# Patient Record
Sex: Male | Born: 1965 | Hispanic: No | Marital: Married | Smoking: Never smoker
Health system: Southern US, Community
[De-identification: ages and names within clinical notes are randomized; demographics above are authoritative.]

## PROBLEM LIST (undated history)

## (undated) DIAGNOSIS — I10 Essential (primary) hypertension: Secondary | ICD-10-CM

## (undated) DIAGNOSIS — E785 Hyperlipidemia, unspecified: Secondary | ICD-10-CM

## (undated) DIAGNOSIS — E119 Type 2 diabetes mellitus without complications: Secondary | ICD-10-CM

## (undated) HISTORY — DX: Essential (primary) hypertension: I10

## (undated) HISTORY — DX: Type 2 diabetes mellitus without complications: E11.9

## (undated) HISTORY — DX: Hyperlipidemia, unspecified: E78.5

---

## 2009-10-16 ENCOUNTER — Ambulatory Visit: Payer: Self-pay | Admitting: Family Medicine

## 2010-04-09 ENCOUNTER — Ambulatory Visit
Admission: RE | Admit: 2010-04-09 | Discharge: 2010-04-09 | Payer: Self-pay | Source: Home / Self Care | Attending: Family Medicine | Admitting: Family Medicine

## 2010-09-19 ENCOUNTER — Other Ambulatory Visit: Payer: Self-pay | Admitting: Family Medicine

## 2010-11-18 ENCOUNTER — Other Ambulatory Visit: Payer: Self-pay | Admitting: Family Medicine

## 2011-01-14 ENCOUNTER — Other Ambulatory Visit: Payer: Self-pay | Admitting: Family Medicine

## 2011-01-17 ENCOUNTER — Telehealth: Payer: Self-pay | Admitting: Family Medicine

## 2011-01-17 MED ORDER — METFORMIN HCL 1000 MG PO TABS: 1000.0000 mg | ORAL_TABLET | Freq: Two times a day (BID) | ORAL | Status: DC

## 2011-01-17 NOTE — Telephone Encounter (Signed)
SENTG MED IN FOR THE 3RD TIME

## 2011-05-10 ENCOUNTER — Other Ambulatory Visit: Payer: Self-pay | Admitting: Family Medicine

## 2011-05-23 ENCOUNTER — Other Ambulatory Visit: Payer: Self-pay | Admitting: Family Medicine

## 2011-06-22 ENCOUNTER — Telehealth: Payer: Self-pay | Admitting: Internal Medicine

## 2011-06-22 MED ORDER — METFORMIN HCL 1000 MG PO TABS: ORAL_TABLET | ORAL | Status: DC

## 2011-06-22 NOTE — Telephone Encounter (Signed)
Called pt to let him know that he needed to come in for a med check before getting in more refills. However pt stated he was on the road and wouldn't be back in til around the 29th, and needed an appt on a Friday. Got him scheduled for April 12th for a med check and gave him a 30day supply so he wouldn't run out.

## 2011-07-15 ENCOUNTER — Other Ambulatory Visit: Payer: Self-pay | Admitting: *Deleted

## 2011-07-15 ENCOUNTER — Ambulatory Visit (INDEPENDENT_AMBULATORY_CARE_PROVIDER_SITE_OTHER): Payer: No Typology Code available for payment source | Admitting: Family Medicine

## 2011-07-15 ENCOUNTER — Encounter: Payer: Self-pay | Admitting: Family Medicine

## 2011-07-15 VITALS — BP 132/94 | HR 72 | Ht 75.0 in | Wt 280.0 lb

## 2011-07-15 DIAGNOSIS — E785 Hyperlipidemia, unspecified: Secondary | ICD-10-CM | POA: Insufficient documentation

## 2011-07-15 DIAGNOSIS — I10 Essential (primary) hypertension: Secondary | ICD-10-CM

## 2011-07-15 DIAGNOSIS — E1169 Type 2 diabetes mellitus with other specified complication: Secondary | ICD-10-CM

## 2011-07-15 DIAGNOSIS — E669 Obesity, unspecified: Secondary | ICD-10-CM

## 2011-07-15 DIAGNOSIS — E1159 Type 2 diabetes mellitus with other circulatory complications: Secondary | ICD-10-CM

## 2011-07-15 DIAGNOSIS — Z79899 Other long term (current) drug therapy: Secondary | ICD-10-CM

## 2011-07-15 DIAGNOSIS — Z9119 Patient's noncompliance with other medical treatment and regimen: Secondary | ICD-10-CM

## 2011-07-15 DIAGNOSIS — Z23 Encounter for immunization: Secondary | ICD-10-CM

## 2011-07-15 DIAGNOSIS — I152 Hypertension secondary to endocrine disorders: Secondary | ICD-10-CM | POA: Insufficient documentation

## 2011-07-15 DIAGNOSIS — E119 Type 2 diabetes mellitus without complications: Secondary | ICD-10-CM

## 2011-07-15 LAB — POCT GLYCOSYLATED HEMOGLOBIN (HGB A1C): Hemoglobin A1C: 10

## 2011-07-15 MED ORDER — ATORVASTATIN CALCIUM 20 MG PO TABS: 20.0000 mg | ORAL_TABLET | Freq: Every day | ORAL | Status: DC

## 2011-07-15 MED ORDER — LISINOPRIL-HYDROCHLOROTHIAZIDE 10-12.5 MG PO TABS: 1.0000 | ORAL_TABLET | Freq: Every day | ORAL | Status: DC

## 2011-07-15 MED ORDER — SAXAGLIPTIN-METFORMIN ER 2.5-1000 MG PO TB24: 2.0000 | ORAL_TABLET | ORAL | Status: DC

## 2011-07-15 NOTE — Progress Notes (Signed)
  Subjective:    Patient ID: Justin Jackson, male    DOB: May 31, 1965, 46 y.o.   MRN: [id number]  HPI He is here for recheck. Review of the record indicates he has not been here in approximately one year. He works as a Medical laboratory scientific officer and has used this as a reason to not followup appropriately or get diabetes education. He does take the metformin twice per day and has had difficulty with diarrhea. He states his fasting blood sugars run between 150 and 200. His exercise pattern is erratic due to his working. He does say however that he has lost 9 pounds since last year. He has not checked them after meals. He did have an eye exam approximately 1 year ago. Social history was reviewed.   Review of Systems     Objective:   Physical Exam alert and in no distress. Tympanic membranes and canals are normal. Throat is clear. Tonsils are normal. Neck is supple without adenopathy or thyromegaly. Cardiac exam shows a regular sinus rhythm without murmurs or gallops. Lungs are clear to auscultation. Foot exam recorded       Assessment & Plan:   1. Type II or unspecified type diabetes mellitus without mention of complication, not stated as uncontrolled  POCT HgB A1C, Saxagliptin-Metformin 2.08-998 MG TB24, Amb ref to Medical Nutrition Therapy-MNT, CBC with Differential, Comprehensive metabolic panel, Lipid panel, POCT UA - Microalbumin, Ambulatory referral to Ophthalmology, Hepatitis B vaccine adult IM  2. Hypertension associated with diabetes  lisinopril-hydrochlorothiazide (PRINZIDE,ZESTORETIC) 10-12.5 MG per tablet  3. Hyperlipidemia LDL goal <70  atorvastatin (LIPITOR) 20 MG tablet  4. Obesity (BMI 30-39.9)  Amb ref to Medical Nutrition Therapy-MNT  5. Encounter for long-term (current) use of other medications    6. Personal history of noncompliance with medical treatment, presenting hazards to health     discussed in detail diabetes and its risks to him in regard to blindness, CVA, heart failure, kidney  failure etc. Strongly encouraged him to go to nutrition classes. I will also place him on the above mentioned drug regimen. He is to call me if he has any difficulties with that.

## 2011-07-16 LAB — COMPREHENSIVE METABOLIC PANEL
ALT: 67 U/L — ABNORMAL HIGH (ref 0–53)
AST: 55 U/L — ABNORMAL HIGH (ref 0–37)
Calcium: 9.5 mg/dL (ref 8.4–10.5)
Chloride: 102 mEq/L (ref 96–112)
Creat: 0.96 mg/dL (ref 0.50–1.35)
Total Bilirubin: 1 mg/dL (ref 0.3–1.2)

## 2011-07-16 LAB — CBC WITH DIFFERENTIAL/PLATELET
Basophils Absolute: 0.1 10*3/uL (ref 0.0–0.1)
Eosinophils Relative: 3 % (ref 0–5)
Lymphocytes Relative: 37 % (ref 12–46)
MCV: 87.3 fL (ref 78.0–100.0)
Neutro Abs: 6.7 10*3/uL (ref 1.7–7.7)
Neutrophils Relative %: 55 % (ref 43–77)
Platelets: 208 10*3/uL (ref 150–400)
RDW: 13.8 % (ref 11.5–15.5)
WBC: 12.2 10*3/uL — ABNORMAL HIGH (ref 4.0–10.5)

## 2011-07-16 LAB — LIPID PANEL
Total CHOL/HDL Ratio: 3.4 Ratio
VLDL: 25 mg/dL (ref 0–40)

## 2011-07-17 NOTE — Progress Notes (Signed)
Quick Note:  The blood work is normal ______ 

## 2011-08-04 ENCOUNTER — Encounter: Payer: Self-pay | Admitting: Internal Medicine

## 2011-08-12 ENCOUNTER — Other Ambulatory Visit: Payer: No Typology Code available for payment source

## 2011-08-19 ENCOUNTER — Ambulatory Visit: Payer: No Typology Code available for payment source | Admitting: *Deleted

## 2011-12-02 ENCOUNTER — Ambulatory Visit: Payer: No Typology Code available for payment source | Admitting: Family Medicine

## 2011-12-16 ENCOUNTER — Ambulatory Visit: Payer: No Typology Code available for payment source | Admitting: Family Medicine

## 2012-01-16 ENCOUNTER — Ambulatory Visit: Payer: No Typology Code available for payment source | Admitting: Family Medicine

## 2012-01-31 ENCOUNTER — Other Ambulatory Visit: Payer: Self-pay | Admitting: Family Medicine

## 2012-03-05 ENCOUNTER — Other Ambulatory Visit: Payer: Self-pay | Admitting: Family Medicine

## 2012-04-03 ENCOUNTER — Other Ambulatory Visit: Payer: Self-pay | Admitting: Family Medicine

## 2012-04-24 ENCOUNTER — Other Ambulatory Visit: Payer: Self-pay

## 2012-04-24 MED ORDER — SAXAGLIPTIN-METFORMIN ER 2.5-1000 MG PO TB24: 2.5000 mg | ORAL_TABLET | Freq: Two times a day (BID) | ORAL | Status: DC

## 2012-04-24 NOTE — Telephone Encounter (Signed)
Refilled diabetes med but he must have diabetes check before anymore refills

## 2012-06-18 ENCOUNTER — Other Ambulatory Visit: Payer: Self-pay

## 2012-06-18 ENCOUNTER — Telehealth: Payer: Self-pay | Admitting: Family Medicine

## 2012-06-18 MED ORDER — SAXAGLIPTIN-METFORMIN ER 2.5-1000 MG PO TB24: 2.5000 mg | ORAL_TABLET | Freq: Two times a day (BID) | ORAL | Status: DC

## 2012-06-18 NOTE — Telephone Encounter (Signed)
Pt called for refill for Kombiglyze, he has diabetic follow up with [place] on 06/29/12.  Wants refill to [facility]

## 2012-06-18 NOTE — Telephone Encounter (Signed)
SENT IN DIABETES MED PT MUST HAVE DIABETES CHECK

## 2012-06-18 NOTE — Telephone Encounter (Signed)
THIS WAS ALREADY DONE THIS MORNING

## 2012-06-29 ENCOUNTER — Ambulatory Visit (INDEPENDENT_AMBULATORY_CARE_PROVIDER_SITE_OTHER): Payer: No Typology Code available for payment source | Admitting: Family Medicine

## 2012-06-29 ENCOUNTER — Encounter: Payer: Self-pay | Admitting: Family Medicine

## 2012-06-29 VITALS — BP 132/92 | HR 76 | Ht 74.0 in | Wt 278.0 lb

## 2012-06-29 DIAGNOSIS — E669 Obesity, unspecified: Secondary | ICD-10-CM

## 2012-06-29 DIAGNOSIS — Z9119 Patient's noncompliance with other medical treatment and regimen: Secondary | ICD-10-CM

## 2012-06-29 DIAGNOSIS — I10 Essential (primary) hypertension: Secondary | ICD-10-CM

## 2012-06-29 DIAGNOSIS — Z91199 Patient's noncompliance with other medical treatment and regimen due to unspecified reason: Secondary | ICD-10-CM

## 2012-06-29 DIAGNOSIS — Z79899 Other long term (current) drug therapy: Secondary | ICD-10-CM

## 2012-06-29 DIAGNOSIS — E119 Type 2 diabetes mellitus without complications: Secondary | ICD-10-CM

## 2012-06-29 DIAGNOSIS — E1159 Type 2 diabetes mellitus with other circulatory complications: Secondary | ICD-10-CM

## 2012-06-29 DIAGNOSIS — E1169 Type 2 diabetes mellitus with other specified complication: Secondary | ICD-10-CM

## 2012-06-29 DIAGNOSIS — E785 Hyperlipidemia, unspecified: Secondary | ICD-10-CM

## 2012-06-29 DIAGNOSIS — I152 Hypertension secondary to endocrine disorders: Secondary | ICD-10-CM

## 2012-06-29 LAB — CBC WITH DIFFERENTIAL/PLATELET
Basophils Absolute: 0.1 10*3/uL (ref 0.0–0.1)
Eosinophils Relative: 3 % (ref 0–5)
Lymphocytes Relative: 38 % (ref 12–46)
Neutro Abs: 6.5 10*3/uL (ref 1.7–7.7)
Neutrophils Relative %: 54 % (ref 43–77)
Platelets: 175 10*3/uL (ref 150–400)
RDW: 14.2 % (ref 11.5–15.5)
WBC: 11.7 10*3/uL — ABNORMAL HIGH (ref 4.0–10.5)

## 2012-06-29 LAB — POCT GLYCOSYLATED HEMOGLOBIN (HGB A1C): Hemoglobin A1C: 10.6

## 2012-06-29 MED ORDER — PIOGLITAZONE HCL 30 MG PO TABS: 30.0000 mg | ORAL_TABLET | Freq: Every day | ORAL | Status: DC

## 2012-06-29 MED ORDER — SAXAGLIPTIN-METFORMIN ER 5-1000 MG PO TB24: 1.0000 | ORAL_TABLET | ORAL | Status: DC

## 2012-06-29 NOTE — Progress Notes (Signed)
  Subjective:    Patient ID: Justin Jackson, male    DOB: 1965-06-19, 46 y.o.   MRN: [id number]  HPI He is here for a diabetes recheck. He has not been seen in quite some time. 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 He has been spending over $300 for the Kombiglyze per month. He states his blood sugars run in the 200 range. His exercise is minimal and he blames this on his work schedule. He was given a referral to the nutritionist but could not get it scheduled. His medications were reviewed. Social history was reviewed. He has not seen an eye doctor. He does not check his feet.   Review of Systems     Objective:   Physical Exam Alert and in no distress. Hemoglobin A1c is 10.6        Assessment & Plan:  Type II or unspecified type diabetes mellitus without mention of complication, not stated as uncontrolled - Plan: HgB A1c, POCT UA - Microalbumin, Ambulatory referral to Ophthalmology, Saxagliptin-Metformin 08-998 MG TB24, pioglitazone (ACTOS) 30 MG tablet  Hypertension associated with diabetes - Plan: CBC with Differential, Comprehensive metabolic panel  Hyperlipidemia LDL goal <70 - Plan: Lipid panel  Obesity (BMI 30-39.9)  Personal history of noncompliance with medical treatment, presenting hazards to health  Encounter for long-term (current) use of other medications - Plan: Lipid panel, CBC with Differential, Comprehensive metabolic panel I discussed exercise with him in regard to his work schedule. Recommend that he use a half hour break that he is required to take to exercise. He is also to call the nutritionist to discuss dietary modification. He was on the wrong dosing of the Kombiglyze. I will switch his medication and add Actos. Discussed the risk of Actos specifically in regards to bladder tumors and explained that it is a very rare occurrence. Discussed the fact that and proper care of his diabetes could eventually end up interfering with his  ability to work as a Naval architect in regard to visual changes, blood pressure and potential need for insulin. Recheck here 3 months.

## 2012-06-29 NOTE — Patient Instructions (Signed)
Call [phone] to set up an appointment with the nutritionist. Do not feel your medication if it is going to cost to $300. Call me instead. Keep in mind what I said about exercising while on the road.

## 2012-06-30 LAB — LIPID PANEL
HDL: 45 mg/dL (ref >39)
LDL Cholesterol: 92 mg/dL (ref 0–99)
Total CHOL/HDL Ratio: 3.6 Ratio
VLDL: 25 mg/dL (ref 0–40)

## 2012-06-30 LAB — COMPREHENSIVE METABOLIC PANEL
ALT: 66 U/L — ABNORMAL HIGH (ref 0–53)
AST: 45 U/L — ABNORMAL HIGH (ref 0–37)
Calcium: 10.1 mg/dL (ref 8.4–10.5)
Chloride: 100 mEq/L (ref 96–112)
Creat: 0.98 mg/dL (ref 0.50–1.35)
Sodium: 137 mEq/L (ref 135–145)

## 2012-07-02 ENCOUNTER — Other Ambulatory Visit: Payer: Self-pay

## 2012-07-02 DIAGNOSIS — I152 Hypertension secondary to endocrine disorders: Secondary | ICD-10-CM

## 2012-07-02 DIAGNOSIS — I10 Essential (primary) hypertension: Secondary | ICD-10-CM

## 2012-07-02 DIAGNOSIS — E785 Hyperlipidemia, unspecified: Secondary | ICD-10-CM

## 2012-07-02 MED ORDER — LISINOPRIL-HYDROCHLOROTHIAZIDE 10-12.5 MG PO TABS: 1.0000 | ORAL_TABLET | Freq: Every day | ORAL | Status: DC

## 2012-07-02 MED ORDER — ATORVASTATIN CALCIUM 20 MG PO TABS: 20.0000 mg | ORAL_TABLET | Freq: Every day | ORAL | Status: DC

## 2012-07-02 NOTE — Progress Notes (Signed)
Quick Note:  CALLED PT # [T NOTIFIED OF LABS NORMAL PT STATES SAXAGLIPTIN METFORMIN 08-998 IS 136.00 A MONTH AND ACTOS IS 15.00 A MONTH ______

## 2012-10-10 ENCOUNTER — Telehealth: Payer: Self-pay | Admitting: Family Medicine

## 2012-10-10 DIAGNOSIS — E119 Type 2 diabetes mellitus without complications: Secondary | ICD-10-CM

## 2012-10-11 ENCOUNTER — Other Ambulatory Visit: Payer: Self-pay

## 2012-10-11 ENCOUNTER — Telehealth: Payer: Self-pay

## 2012-10-11 MED ORDER — GLYBURIDE 5 MG PO TABS: 5.0000 mg | ORAL_TABLET | Freq: Every day | ORAL | Status: DC

## 2012-10-11 MED ORDER — SAXAGLIPTIN-METFORMIN ER 2.5-1000 MG PO TB24: 2.0000 | ORAL_TABLET | ORAL | Status: DC

## 2012-10-11 NOTE — Telephone Encounter (Signed)
LEFT WORD FOR WORD MESSAGE Let him know he's not on maximum dosing and I called it in; and have him reschedule in 3 months

## 2012-10-11 NOTE — Telephone Encounter (Signed)
Let him know that he's not on maximum dosing I would like to get him on maximum dosing before switching have him reschedule in 3 months

## 2012-10-11 NOTE — Telephone Encounter (Signed)
Sent med in per jcl 

## 2012-10-11 NOTE — Telephone Encounter (Signed)
I EXPLAINED TO THE PT THAT ACTUALLY HE WAS GETTING DOUBLE THE METFORMIN SINCE HE WAS ON 08/998 ONCE A DAY AND MED WAS CHANGED TO 2.08/998 MG 2 TIMES A DAY PATIENT WAS INSISTENT ON GLIMEPIRIDE I TOLD HIM OK IT WOULD BE SENT IN

## 2012-10-11 NOTE — Telephone Encounter (Signed)
Let him know he's not on maximum dosing and I called it in; and have him reschedule in 3 months

## 2012-10-11 NOTE — Telephone Encounter (Signed)
Go ahead and call in the glyburide which I think is 5 mg a day

## 2012-10-11 NOTE — Telephone Encounter (Signed)
PATIENT DOESN'T WANT THIS MED HE SAID IT IS ACTUALLY LOWER THAN THE ONE HE IS ALREADY ON HE WANTS ONE THAT WILL LOWER HIS SUGARS SAID HE DISCUSSED THIS WITH YOU AT HIS LAST APPOINTMENT HE WANTS GLIMEPIRIDE SAID HE HAS FAMILY MEMBERS ON IT AND IT WORKS GOOD HE WAS VERY DEMANDING AND UPSET SO PLEASE ADVISE

## 2012-10-12 ENCOUNTER — Ambulatory Visit: Payer: No Typology Code available for payment source | Admitting: Family Medicine

## 2012-12-01 ENCOUNTER — Other Ambulatory Visit: Payer: Self-pay | Admitting: Family Medicine

## 2013-02-16 ENCOUNTER — Other Ambulatory Visit: Payer: Self-pay | Admitting: Family Medicine

## 2013-05-12 ENCOUNTER — Other Ambulatory Visit: Payer: Self-pay | Admitting: Family Medicine

## 2014-07-13 ENCOUNTER — Other Ambulatory Visit: Payer: Self-pay | Admitting: Family Medicine

## 2015-08-31 ENCOUNTER — Emergency Department (HOSPITAL_COMMUNITY)
Admission: EM | Admit: 2015-08-31 | Discharge: 2015-08-31 | Disposition: A | Discharge status: Home / Self Care | Payer: 59 | Attending: Emergency Medicine | Admitting: Emergency Medicine

## 2015-08-31 ENCOUNTER — Encounter (HOSPITAL_COMMUNITY): Payer: Self-pay | Admitting: Emergency Medicine

## 2015-08-31 ENCOUNTER — Emergency Department (HOSPITAL_COMMUNITY): Payer: 59

## 2015-08-31 DIAGNOSIS — Z7984 Long term (current) use of oral hypoglycemic drugs: Secondary | ICD-10-CM | POA: Diagnosis not present

## 2015-08-31 DIAGNOSIS — Z79891 Long term (current) use of opiate analgesic: Secondary | ICD-10-CM | POA: Diagnosis not present

## 2015-08-31 DIAGNOSIS — X509XXA Other and unspecified overexertion or strenuous movements or postures, initial encounter: Secondary | ICD-10-CM | POA: Insufficient documentation

## 2015-08-31 DIAGNOSIS — M25551 Pain in right hip: Secondary | ICD-10-CM | POA: Diagnosis present

## 2015-08-31 DIAGNOSIS — Y939 Activity, unspecified: Secondary | ICD-10-CM | POA: Insufficient documentation

## 2015-08-31 DIAGNOSIS — Y92812 Truck as the place of occurrence of the external cause: Secondary | ICD-10-CM | POA: Insufficient documentation

## 2015-08-31 DIAGNOSIS — I10 Essential (primary) hypertension: Secondary | ICD-10-CM | POA: Insufficient documentation

## 2015-08-31 DIAGNOSIS — E119 Type 2 diabetes mellitus without complications: Secondary | ICD-10-CM | POA: Insufficient documentation

## 2015-08-31 DIAGNOSIS — Z79899 Other long term (current) drug therapy: Secondary | ICD-10-CM | POA: Insufficient documentation

## 2015-08-31 DIAGNOSIS — R52 Pain, unspecified: Secondary | ICD-10-CM

## 2015-08-31 DIAGNOSIS — Y999 Unspecified external cause status: Secondary | ICD-10-CM | POA: Insufficient documentation

## 2015-08-31 LAB — URINALYSIS, ROUTINE W REFLEX MICROSCOPIC
BILIRUBIN URINE: NEGATIVE
Glucose, UA: >1000 mg/dL — AB
HGB URINE DIPSTICK: NEGATIVE
Ketones, ur: NEGATIVE mg/dL
Leukocytes, UA: NEGATIVE
NITRITE: NEGATIVE
PROTEIN: NEGATIVE mg/dL
SPECIFIC GRAVITY, URINE: 1.04 — AB (ref 1.005–1.030)
pH: 5.5 (ref 5.0–8.0)

## 2015-08-31 LAB — I-STAT CHEM 8, ED
BUN: 11 mg/dL (ref 6–20)
CALCIUM ION: 1.14 mmol/L (ref 1.12–1.23)
Chloride: 99 mmol/L — ABNORMAL LOW (ref 101–111)
Creatinine, Ser: 0.8 mg/dL (ref 0.61–1.24)
Glucose, Bld: 307 mg/dL — ABNORMAL HIGH (ref 65–99)
HCT: 49 % (ref 39.0–52.0)
Hemoglobin: 16.7 g/dL (ref 13.0–17.0)
Potassium: 3.9 mmol/L (ref 3.5–5.1)
SODIUM: 138 mmol/L (ref 135–145)
TCO2: 24 mmol/L (ref 0–100)

## 2015-08-31 LAB — URINE MICROSCOPIC-ADD ON
Bacteria, UA: NONE SEEN
RBC / HPF: NONE SEEN RBC/hpf (ref 0–5)
SQUAMOUS EPITHELIAL / LPF: NONE SEEN

## 2015-08-31 LAB — CBG MONITORING, ED: GLUCOSE-CAPILLARY: 235 mg/dL — AB (ref 65–99)

## 2015-08-31 MED ORDER — MELOXICAM 15 MG PO TABS: 15.0000 mg | ORAL_TABLET | Freq: Every day | ORAL | Status: DC

## 2015-08-31 MED ORDER — KETOROLAC TROMETHAMINE 60 MG/2ML IM SOLN
60.0000 mg | Freq: Once | INTRAMUSCULAR | Status: AC
  Administered 2015-08-31: 60 mg via INTRAMUSCULAR
  Filled 2015-08-31: qty 2

## 2015-08-31 MED ORDER — OXYCODONE-ACETAMINOPHEN 5-325 MG PO TABS
2.0000 | ORAL_TABLET | Freq: Once | ORAL | Status: AC
  Administered 2015-08-31: 2 via ORAL
  Filled 2015-08-31: qty 2

## 2015-08-31 MED ORDER — HYDROCODONE-ACETAMINOPHEN 5-325 MG PO TABS: 2.0000 | ORAL_TABLET | ORAL | Status: DC | PRN

## 2015-08-31 NOTE — Discharge Instructions (Signed)
Follow-up with your primary care provider in 2 days to have your hip reevaluated. Contact Dr. Luiz BlareGraves office, orthopedist, to be seen regarding your hip pain and possible trochanteric bursitis as early as tomorrow.  Take the Mobic as prescribed and use Tylenol in between for breakthrough pain. Do not take Tylenol with the Norco. Take Norco at night as needed for pain.  Return to the emergency department if you experience worsening back pain and worsening hip pain, numbness/tingling or weakness, nausea, vomiting.  Hip Pain Your hip is the joint between your upper legs and your lower pelvis. The bones, cartilage, tendons, and muscles of your hip joint perform a lot of work each day supporting your body weight and allowing you to move around. Hip pain can range from a minor ache to severe pain in one or both of your hips. Pain may be felt on the inside of the hip joint near the groin, or the outside near the buttocks and upper thigh. You may have swelling or stiffness as well.  HOME CARE INSTRUCTIONS   Take medicines only as directed by your health care provider.  Apply ice to the injured area:  Put ice in a plastic bag.  Place a towel between your skin and the bag.  Leave the ice on for 15-20 minutes at a time, 3-4 times a day.  Keep your leg raised (elevated) when possible to lessen swelling.  Avoid activities that cause pain.  Follow specific exercises as directed by your health care provider.  Sleep with a pillow between your legs on your most comfortable side.  Record how often you have hip pain, the location of the pain, and what it feels like. SEEK MEDICAL CARE IF:   You are unable to put weight on your leg.  Your hip is red or swollen or very tender to touch.  Your pain or swelling continues or worsens after 1 week.  You have increasing difficulty walking.  You have a fever. SEEK IMMEDIATE MEDICAL CARE IF:   You have fallen.  You have a sudden increase in pain and  swelling in your hip. MAKE SURE YOU:   Understand these instructions.  Will watch your condition.  Will get help right away if you are not doing well or get worse.   This information is not intended to replace advice given to you by your health care provider. Make sure you discuss any questions you have with your health care provider.   Document Released: 09/08/2009 Document Revised: 04/11/2014 Document Reviewed: 11/15/2012 Elsevier Interactive Patient Education [employer]2016 [employer]vier Inc.

## 2015-08-31 NOTE — ED Provider Notes (Signed)
CSN: [id number]     Arrival date & time 08/31/15  4098 History   First MD Initiated Contact with Patient 08/31/15 0745     Chief Complaint  Patient presents with  . Hip Pain     (Consider location/radiation/quality/duration/timing/severity/associated sxs/prior Treatment) HPI   Patient is a 50 year old male with history of diabetes, HTN, hyperlipidemia presents the ED with worsening right hip pain for 4 days. He states he may have stepped out of his truck wrong 4 days ago because the pain began later that day and has progressively gotten worse. Patient states this pain is similar to pain he's had in the past that occurs roughly once a month but this pain is more intense. It is sharp, constant, 10/10 in his right lower back, anterior and lateral hip and superior anterior thigh. Patient states lying flat and walking make it worse and nothing makes it better. He has taken Excedrin for the pain which has not helped. Patient denies dysuria, hematuria, numbness/tingling, weakness, nausea, vomiting, change in bowel habits, chest pain, shortness of breath.  Past Medical History  Diagnosis Date  . DM (diabetes mellitus), type 2 (HCC)   . Hyperlipidemia   . Hypertension    History reviewed. No pertinent past surgical history. History reviewed. No pertinent family history. Social History  Substance Use Topics  . Smoking status: Never Smoker   . Smokeless tobacco: None  . Alcohol Use: No    Review of Systems  Constitutional: Negative for fever and chills.  Respiratory: Negative for chest tightness and shortness of breath.   Cardiovascular: Negative for chest pain and leg swelling.  Gastrointestinal: Negative for nausea, vomiting and diarrhea.  Genitourinary: Negative for dysuria, hematuria, penile swelling, scrotal swelling and testicular pain.  Musculoskeletal: Positive for myalgias, back pain and arthralgias. Negative for joint swelling and neck pain.  Skin: Negative for rash.   Neurological: Negative for dizziness, syncope, weakness, numbness and headaches.      Allergies  Review of patient's allergies indicates no known allergies.  Home Medications   Prior to Admission medications   Medication Sig Start Date End Date Taking? Authorizing Provider  atorvastatin (LIPITOR) 20 MG tablet Take 20 mg by mouth daily.   Yes Historical Provider, MD  glyBURIDE (DIABETA) 5 MG tablet TAKE 1 TABLET BY MOUTH DAILY WITH BREAKFAST 12/01/12  Yes Ronnald Nian, MD  lisinopril-hydrochlorothiazide (PRINZIDE,ZESTORETIC) 10-12.5 MG tablet Take 1 tablet by mouth daily.   Yes Historical Provider, MD  metFORMIN (GLUCOPHAGE) 1000 MG tablet Take 1,000 mg by mouth 2 (two) times daily with a meal.   Yes Historical Provider, MD  atorvastatin (LIPITOR) 20 MG tablet Take 1 tablet (20 mg total) by mouth daily. 07/02/12 07/02/13  Ronnald Nian, MD  HYDROcodone-acetaminophen (NORCO/VICODIN) 5-325 MG tablet Take 2 tablets by mouth every 4 (four) hours as needed. 08/31/15   Jerre Simon, PA  lisinopril-hydrochlorothiazide (PRINZIDE,ZESTORETIC) 10-12.5 MG per tablet Take 1 tablet by mouth daily. 07/02/12 07/02/13  Ronnald Nian, MD  meloxicam (MOBIC) 15 MG tablet Take 1 tablet (15 mg total) by mouth daily. 08/31/15   Jerre Simon, PA  pioglitazone (ACTOS) 30 MG tablet Take 1 tablet (30 mg total) by mouth daily. (PATIENT IS DUE FOR DUE FOR DIABETES CHECK [phone]) Patient not taking: Reported on 08/31/2015 02/16/13   Ronnald Nian, MD  Saxagliptin-Metformin 2.08-998 MG TB24 Take 2 capsules by mouth 1 day or 1 dose. Patient not taking: Reported on 08/31/2015 10/11/12   Ronnald Nian, MD  BP 160/113 mmHg  Pulse 108  Temp(Src) 97.9 F (36.6 C) (Oral)  Resp 16  SpO2 98% Physical Exam  Constitutional: He appears well-developed and well-nourished. No distress.  HENT:  Head: Normocephalic and atraumatic.  Eyes: Conjunctivae are normal.  Neck: Normal range of motion.  Cardiovascular: Regular  rhythm and normal heart sounds.  Tachycardia present.  Exam reveals no gallop and no friction rub.   No murmur heard. Pulses:      Posterior tibial pulses are 2+ on the right side, and 2+ on the left side.  Pulmonary/Chest: Effort normal and breath sounds normal. No respiratory distress. He has no wheezes. He has no rales.  Abdominal: Normal appearance and bowel sounds are normal. He exhibits no distension. There is no tenderness. There is no rigidity, no rebound and no guarding.  Musculoskeletal:  Examination of the right hip revealed no ecchymosis, deformity, edema. Full AROM of the right and left hip. Strength 5/5 of bilateral lower extremities including plantar flexion and extension. Patient is neurovascularly intact distally. TTP of the trochanteric bursa.   Neurological: He is alert. He has normal strength. No sensory deficit. Coordination normal.  Skin: Skin is warm and dry. No rash noted. He is not diaphoretic.  Psychiatric: He has a normal mood and affect. His behavior is normal.    ED Course  Procedures (including critical care time)  9:50am: pt states pain is a 7/10 after the Toradol. Will give Percocet to see if his pain improves. Will also check urinalysis to rule out kidney etiology.   11:24 am: hip pain now a 4/10, waiting on UA, pain with palpation on the greater trochanter. Could be bursitis.   Labs Review Labs Reviewed  URINALYSIS, ROUTINE W REFLEX MICROSCOPIC (NOT AT [facility]) - Abnormal; Notable for the following:    Specific Gravity, Urine 1.040 (*)    Glucose, UA >1000 (*)    All other components within normal limits  I-STAT CHEM 8, ED - Abnormal; Notable for the following:    Chloride 99 (*)    Glucose, Bld 307 (*)    All other components within normal limits  CBG MONITORING, ED - Abnormal; Notable for the following:    Glucose-Capillary 235 (*)    All other components within normal limits  URINE MICROSCOPIC-ADD ON    Imaging Review Dg Hip Unilat With Pelvis  2-3 Views Right  08/31/2015  CLINICAL DATA:  Right hip pain for several days, no known injury, initial encounter EXAM: DG HIP (WITH OR WITHOUT PELVIS) 2-3V RIGHT COMPARISON:  None. FINDINGS: Degenerative changes of the hip joints are noted bilaterally. The pelvic ring is intact. No acute fracture or dislocation is noted. No soft tissue changes are seen. IMPRESSION: Degenerative change without acute abnormality. Electronically Signed   By: Alcide CleverMark  Lukens M.D.   On: 08/31/2015 09:03   I have personally reviewed and evaluated these images and lab results as part of my medical decision-making.   EKG Interpretation None      MDM   Final diagnoses:  Pain  Right hip pain    Patient with hip and low back pain.  No neurological deficits and normal neuro exam.  Patient can walk but states it is painful.  No loss of bowel or bladder control.  No concern for cauda equina.  No fever, night sweats, weight loss, h/o cancer, IVDU.  No abdominal pain, no genital complaints, no urinary complaints and no changes in bowel habits less likely this is intraabdominal in nature. Pain was  well controlled in the ED. RICE protocol and pain medicine indicated and discussed with patient. Pt's BP was elevated at time of arrival to the ED likely 2/2 pain with subsequent BP's within normal range. Pt's pain was reproducible on exam and he was very tender on the trochanteric bursa. His pain is likely due to his degenerative changes and trochanteric bursitis. This pain is similar to his chronic hip pain. Hip xray were negative for any acute bony abnormality. I instructed the pt to follow up with an orthopedist within 2 days to have his hip reevaluated. i discussed strict return precautions. Pt was stable and well appearing at time of discharge. I gave him a prescription for Mobic and Norco. I discussed all of the results with the patient and family members they have expressed their understanding to the verbal discharge  instructions.      Jerre Simon, PA 08/31/15 1527  Melene Plan, DO 09/01/15 [phone]

## 2015-08-31 NOTE — ED Notes (Signed)
Pt c/o stabbing, constant pain to right posterior and anterior hip, worse when laying down and "stretching out," onset Thursday while getting out of a truck. No trauma, injury. History of the same but more mild.

## 2015-08-31 NOTE — ED Notes (Signed)
PT have been made aware of urine sample 

## 2015-09-18 ENCOUNTER — Ambulatory Visit (INDEPENDENT_AMBULATORY_CARE_PROVIDER_SITE_OTHER): Payer: 59 | Admitting: Emergency Medicine

## 2015-09-18 ENCOUNTER — Telehealth: Payer: Self-pay | Admitting: *Deleted

## 2015-09-18 VITALS — BP 120/84 | HR 96 | Temp 98.1°F | Resp 15 | Ht 74.0 in | Wt 283.4 lb

## 2015-09-18 DIAGNOSIS — E119 Type 2 diabetes mellitus without complications: Secondary | ICD-10-CM | POA: Diagnosis not present

## 2015-09-18 DIAGNOSIS — M25551 Pain in right hip: Secondary | ICD-10-CM

## 2015-09-18 LAB — GLUCOSE, POCT (MANUAL RESULT ENTRY): POC Glucose: 160 mg/dL — AB (ref 70–99)

## 2015-09-18 MED ORDER — MELOXICAM 15 MG PO TABS: 15.0000 mg | ORAL_TABLET | Freq: Every day | ORAL | Status: DC

## 2015-09-18 MED ORDER — GABAPENTIN 100 MG PO CAPS: 100.0000 mg | ORAL_CAPSULE | Freq: Three times a day (TID) | ORAL | Status: DC

## 2015-09-18 MED ORDER — HYDROCODONE-ACETAMINOPHEN 5-325 MG PO TABS: 2.0000 | ORAL_TABLET | ORAL | Status: DC | PRN

## 2015-09-18 NOTE — Progress Notes (Deleted)
By signing my name below, I, Justin Jackson, attest that this documentation has been prepared under the direction and in the presence of Justin ChrisSteven Daub, MD.  Electronically Signed: Arvilla MarketMesha Jackson, Medical Scribe. 09/18/2015. 12:09 PM.  Chief Complaint:  Chief Complaint  Patient presents with   Hip Pain    Right hip. 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 Seeking cortisone shot    HPI: Justin Jackson is a 50 y.o. male with a PMHx of DM who [facility] complaining of right lateral hip pain onset 5/26. Pt stepped out of a truck and injured his hip that caused intense right hip pain the day of injury. Pt has had worsening hip pain since. Pt states the pain radiates to his thigh. Pain worsens with ambulation. Pt went to the Emergency Room Memorial Day (4 days after injury) because the pain was so bad. Pt had a x-ray done to his hip, and degerative changes were seen- no hip fractures. Pt could hardly ambulate or stand at the time. He was given a medication for inflamation and pain that gave little relief to the pain. Pt states the pain got worse last night and has disturbed his sleep.  Pt is a Naval architecttruck driver.  Past Medical History  Diagnosis Date   DM (diabetes mellitus), type 2 (HCC)    Hyperlipidemia    Hypertension    No past surgical history on file. Social History   Social History   Marital Status: Divorced    Spouse Name: [name]   Number of Children: [name]   Years of Education: [name]   Social History Main Topics   Smoking status: Never Smoker    Smokeless tobacco: None   Alcohol Use: No   Drug Use: No   Sexual Activity: Not Asked   Other Topics Concern   None   Social History Narrative   No family history on file. No Known Allergies Prior to Admission medications   Medication Sig Start Date End Date Taking? Authorizing Provider  glyBURIDE (DIABETA) 5 MG tablet TAKE 1 TABLET BY MOUTH DAILY WITH BREAKFAST 12/01/12  Yes Ronnald NianJohn C Lalonde, MD    lisinopril-hydrochlorothiazide (PRINZIDE,ZESTORETIC) 10-12.5 MG tablet Take 1 tablet by mouth daily.   Yes Historical Provider, MD  metFORMIN (GLUCOPHAGE) 1000 MG tablet Take 1,000 mg by mouth 2 (two) times daily with a meal.   Yes Historical Provider, MD  pioglitazone (ACTOS) 30 MG tablet Take 1 tablet (30 mg total) by mouth daily. (PATIENT IS DUE FOR DUE FOR DIABETES CHECK [phone]) 02/16/13  Yes Ronnald NianJohn C Lalonde, MD  atorvastatin (LIPITOR) 20 MG tablet Take 1 tablet (20 mg total) by mouth daily. 07/02/12 07/02/13  Ronnald NianJohn C Lalonde, MD  HYDROcodone-acetaminophen (NORCO/VICODIN) 5-325 MG tablet Take 2 tablets by mouth every 4 (four) hours as needed. Patient not taking: Reported on 09/18/2015 08/31/15   [name], PA  lisinopril-hydrochlorothiazide (PRINZIDE,ZESTORETIC) 10-12.5 MG per tablet Take 1 tablet by mouth daily. 07/02/12 07/02/13  Ronnald NianJohn C Lalonde, MD  meloxicam (MOBIC) 15 MG tablet Take 1 tablet (15 mg total) by mouth daily. Patient not taking: Reported on 09/18/2015 08/31/15   [name], PA  Saxagliptin-Metformin 2.08-998 MG TB24 Take 2 capsules by mouth 1 day or 1 dose. Patient not taking: Reported on 08/31/2015 10/11/12   Ronnald NianJohn C Lalonde, MD     ROS: The patient denies fevers, chills, night sweats, unintentional weight loss, chest pain, palpitations, wheezing, dyspnea on exertion, nausea, vomiting, abdominal pain, dysuria, hematuria, melena, numbness, weakness, or tingling.  All other systems have been reviewed and were otherwise negative with the exception of those mentioned in the HPI and as above.    PHYSICAL EXAM: Filed Vitals:   09/18/15 1207  BP: 120/84  Pulse: 96  Temp: 98.1 F (36.7 C)  Resp: 15   Body mass index is 36.37 kg/(m^2).   General: Alert, no acute distress HEENT:  Normocephalic, atraumatic, oropharynx patent. Eye: Nonie Hoyer [facility] Cardiovascular:  Regular rate and rhythm, no rubs murmurs or gallops.  No Carotid bruits, radial pulse intact. No pedal edema.   Respiratory: Clear to auscultation bilaterally.  No wheezes, rales, or rhonchi.  No cyanosis, no use of accessory musculature Abdominal: No organomegaly, abdomen is soft and non-tender, positive bowel sounds.  No masses. Musculoskeletal: Gait intact. No edema, tenderness. No tenderness over lumbar spine. Deep tender reflex knees; 2+ left,  2+ right. Ankles 2+. Right hip is limited internal and external rotation Skin: No rashes. Neurologic: Facial musculature symmetric. Psychiatric: Patient acts appropriately throughout our interaction. Lymphatic: No cervical or submandibular lymphadenopathy  LABS: Results for orders placed or performed in visit on 09/18/15  POCT glucose (manual entry)  Result Value Ref Range   POC Glucose 160 (A) 70 - 99 mg/dl    EKG/XRAY:   Primary read interpreted by Dr. Cleta Alberts at [facility].   ASSESSMENT/PLAN: Patient  Will take mobic 15 one a day. He was given a prescription for hydrocodone to have for severe pain He was given a prescription for Neurontin. He knows not to drive with these medications. Referral made to orthopedics.  Gross sideeffects, risk and benefits, and alternatives of medications d/w patient. Patient is aware that all medications have potential sideeffects and we are unable to predict every sideeffect or drug-drug interaction that may occur.  Justin Chris MD 09/18/2015 12:09 PM

## 2015-09-18 NOTE — Telephone Encounter (Signed)
Left message in voice mail with appointment/date Tues 09[facility]/15 [facility] Dr Linna CapriceSwinteck.

## 2015-09-18 NOTE — Patient Instructions (Addendum)
I have put in a referral for you to see an orthopedist next week. We will start her back on Lovaza 1 a day. I have given you hydrocodone for breakthrough pain. I also started you on Neurontin 100 mg to take 2 capsules to 3 times a day for your leg pain. You cannot take the pain medication or Neurontin and drive.Hip Pain Your hip is the joint between your upper legs and your lower pelvis. The bones, cartilage, tendons, and muscles of your hip joint perform a lot of work each day supporting your body weight and allowing you to move around. Hip pain can range from a minor ache to severe pain in one or both of your hips. Pain may be felt on the inside of the hip joint near the groin, or the outside near the buttocks and upper thigh. You may have swelling or stiffness as well.  HOME CARE INSTRUCTIONS   Take medicines only as directed by your health care provider.  Apply ice to the injured area:  Put ice in a plastic bag.  Place a towel between your skin and the bag.  Leave the ice on for 15-20 minutes at a time, 3-4 times a day.  Keep your leg raised (elevated) when possible to lessen swelling.  Avoid activities that cause pain.  Follow specific exercises as directed by your health care provider.  Sleep with a pillow between your legs on your most comfortable side.  Record how often you have hip pain, the location of the pain, and what it feels like. SEEK MEDICAL CARE IF:   You are unable to put weight on your leg.  Your hip is red or swollen or very tender to touch.  Your pain or swelling continues or worsens after 1 week.  You have increasing difficulty walking.  You have a fever. SEEK IMMEDIATE MEDICAL CARE IF:   You have fallen.  You have a sudden increase in pain and swelling in your hip. MAKE SURE YOU:   Understand these instructions.  Will watch your condition.  Will get help right away if you are not doing well or get worse.   This information is not intended to  replace advice given to you by your health care provider. Make sure you discuss any questions you have with your health care provider.   Document Released: 09/08/2009 Document Revised: 04/11/2014 Document Reviewed: 11/15/2012 Elsevier Interactive Patient Education [employer]2016 Elsevier Inc.     IF you received an x-ray today, you will receive an invoice from [facility]. Please contact [facility] at [phone] with questions or concerns regarding your invoice.   IF you received labwork today, you will receive an invoice from [facility]. Please contact [facility] at [phone] with questions or concerns regarding your invoice.   Our billing staff will not be able to assist you with questions regarding bills from these companies.  You will be contacted with the lab results as soon as they are available. The fastest way to get your results is to activate your My Chart account. Instructions are located on the last page of this paperwork. If you have not heard from us regarding the results in 2 weeks, please contact this office.

## 2015-09-18 NOTE — Progress Notes (Signed)
By signing my name below, I, [name], attest that this documentation has been prepared under the direction and in the presence of Lesle ChrisSteven Kush Farabee, MD.  Electronically Signed: [name], Medical Scribe. 09/18/2015. 12:09 PM.  Chief Complaint:  Chief Complaint  Patient presents with  . Hip Pain    Right hip. 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 Seeking cortisone shot    HPI: Justin Jackson is a 50 y.o. male with a PMHx of DM who [facility] complaining of right lateral hip pain onset 5/26. Pt stepped out of a truck and injured his hip that caused intense right hip pain the day of injury. Pt has had worsening hip pain since. Pt states the pain radiates to his thigh. Pain worsens with ambulation. Pt went to the Emergency Room Memorial Day (4 days after injury) because the pain was so bad. Pt had a x-ray done to his hip, and degerative changes were seen- no hip fractures. Pt could hardly ambulate or stand at the time. He was given a medication for inflamation and pain that gave little relief to the pain. Pt states the pain got worse last night and has disturbed his sleep.  Pt is a [employer] architecttruck driver.  Past Medical History  Diagnosis Date  . DM (diabetes mellitus), type 2 (HCC)   . Hyperlipidemia   . Hypertension    No past surgical history on file. Social History   Social History  . Marital Status: Divorced    Spouse Name: [name]  . Number of Children: [name]  . Years of Education: [name]   Social History Main Topics  . Smoking status: Never Smoker   . Smokeless tobacco: None  . Alcohol Use: No  . Drug Use: No  . Sexual Activity: Not Asked   Other Topics Concern  . None   Social History Narrative   No family history on file. No Known Allergies Prior to Admission medications   Medication Sig Start Date End Date Taking? Authorizing Provider  glyBURIDE (DIABETA) 5 MG tablet TAKE 1 TABLET BY MOUTH DAILY WITH BREAKFAST 12/01/12  Yes Ronnald NianJohn C Lalonde, MD    lisinopril-hydrochlorothiazide (PRINZIDE,ZESTORETIC) 10-12.5 MG tablet Take 1 tablet by mouth daily.   Yes Historical Provider, MD  metFORMIN (GLUCOPHAGE) 1000 MG tablet Take 1,000 mg by mouth 2 (two) times daily with a meal.   Yes Historical Provider, MD  pioglitazone (ACTOS) 30 MG tablet Take 1 tablet (30 mg total) by mouth daily. (PATIENT IS DUE FOR DUE FOR DIABETES CHECK [phone]) 02/16/13  Yes Ronnald NianJohn C Lalonde, MD  atorvastatin (LIPITOR) 20 MG tablet Take 1 tablet (20 mg total) by mouth daily. 07/02/12 07/02/13  Ronnald NianJohn C Lalonde, MD  HYDROcodone-acetaminophen (NORCO/VICODIN) 5-325 MG tablet Take 2 tablets by mouth every 4 (four) hours as needed. Patient not taking: Reported on 09/18/2015 08/31/15   [name], PA  lisinopril-hydrochlorothiazide (PRINZIDE,ZESTORETIC) 10-12.5 MG per tablet Take 1 tablet by mouth daily. 07/02/12 07/02/13  Ronnald NianJohn C Lalonde, MD  meloxicam (MOBIC) 15 MG tablet Take 1 tablet (15 mg total) by mouth daily. Patient not taking: Reported on 09/18/2015 08/31/15   [name], PA  Saxagliptin-Metformin 2.08-998 MG TB24 Take 2 capsules by mouth 1 day or 1 dose. Patient not taking: Reported on 08/31/2015 10/11/12   Ronnald NianJohn C Lalonde, MD     ROS: The patient denies fevers, chills, night sweats, unintentional weight loss, chest pain, palpitations, wheezing, dyspnea on exertion, nausea, vomiting, abdominal pain, dysuria, hematuria, melena, numbness, weakness, or tingling.  All other systems have been reviewed and were otherwise negative with the exception of those mentioned in the HPI and as above.    PHYSICAL EXAM: Filed Vitals:   09/18/15 1207  BP: 120/84  Pulse: 96  Temp: 98.1 F (36.7 C)  Resp: 15   Body mass index is 36.37 kg/(m^2).   General: Alert, no acute distress HEENT:  Normocephalic, atraumatic, oropharynx patent. Eye: Nonie Hoyer [facility] Cardiovascular:  Regular rate and rhythm, no rubs murmurs or gallops.  No Carotid bruits, radial pulse intact. No pedal edema.   Respiratory: Clear to auscultation bilaterally.  No wheezes, rales, or rhonchi.  No cyanosis, no use of accessory musculature Abdominal: No organomegaly, abdomen is soft and non-tender, positive bowel sounds.  No masses. Musculoskeletal: Gait intact. No edema, tenderness. No tenderness over lumbar spine. Deep tender reflex knees; 2+ left,  2+ right. Ankles 2+. Right hip is limited internal and external rotation Skin: No rashes. Neurologic: Facial musculature symmetric. Psychiatric: Patient acts appropriately throughout our interaction. Lymphatic: No cervical or submandibular lymphadenopathy  LABS: Results for orders placed or performed in visit on 09/18/15  POCT glucose (manual entry)  Result Value Ref Range   POC Glucose 160 (A) 70 - 99 mg/dl    EKG/XRAY:   Primary read interpreted by [facility]. Cleta Alberts at [facility].   ASSESSMENT/PLAN: Patient  Will take mobic 15 one a day. He was given a prescription for hydrocodone to have for severe pain He was given a prescription for Neurontin. He knows not to drive with these medications. Referral made to orthopedics. I personally performed the services described in this documentation, which was scribed in my presence. The recorded information has been reviewed and is accurate. Gross sideeffects, risk and benefits, and alternatives of medications d/w patient. Patient is aware that all medications have potential sideeffects and we are unable to predict every sideeffect or drug-drug interaction that may occur.  Lesle Chris MD 09/18/2015 12:09 PM

## 2015-11-09 ENCOUNTER — Other Ambulatory Visit: Payer: Self-pay | Admitting: Emergency Medicine

## 2015-11-11 ENCOUNTER — Other Ambulatory Visit: Payer: Self-pay | Admitting: Emergency Medicine

## 2016-01-21 ENCOUNTER — Other Ambulatory Visit: Payer: Self-pay | Admitting: Emergency Medicine

## 2016-02-21 ENCOUNTER — Other Ambulatory Visit: Payer: Self-pay | Admitting: Emergency Medicine

## 2018-05-28 IMAGING — CR DG HIP (WITH OR WITHOUT PELVIS) 2-3V*R*
3 series · 3 of 3 positions shown · non-contrast
Comparison: None.

CLINICAL DATA: Right hip pain for several days, no known injury,
initial encounter

EXAM:
DG HIP (WITH OR WITHOUT PELVIS) 2-3V RIGHT

[t pelvis ap]
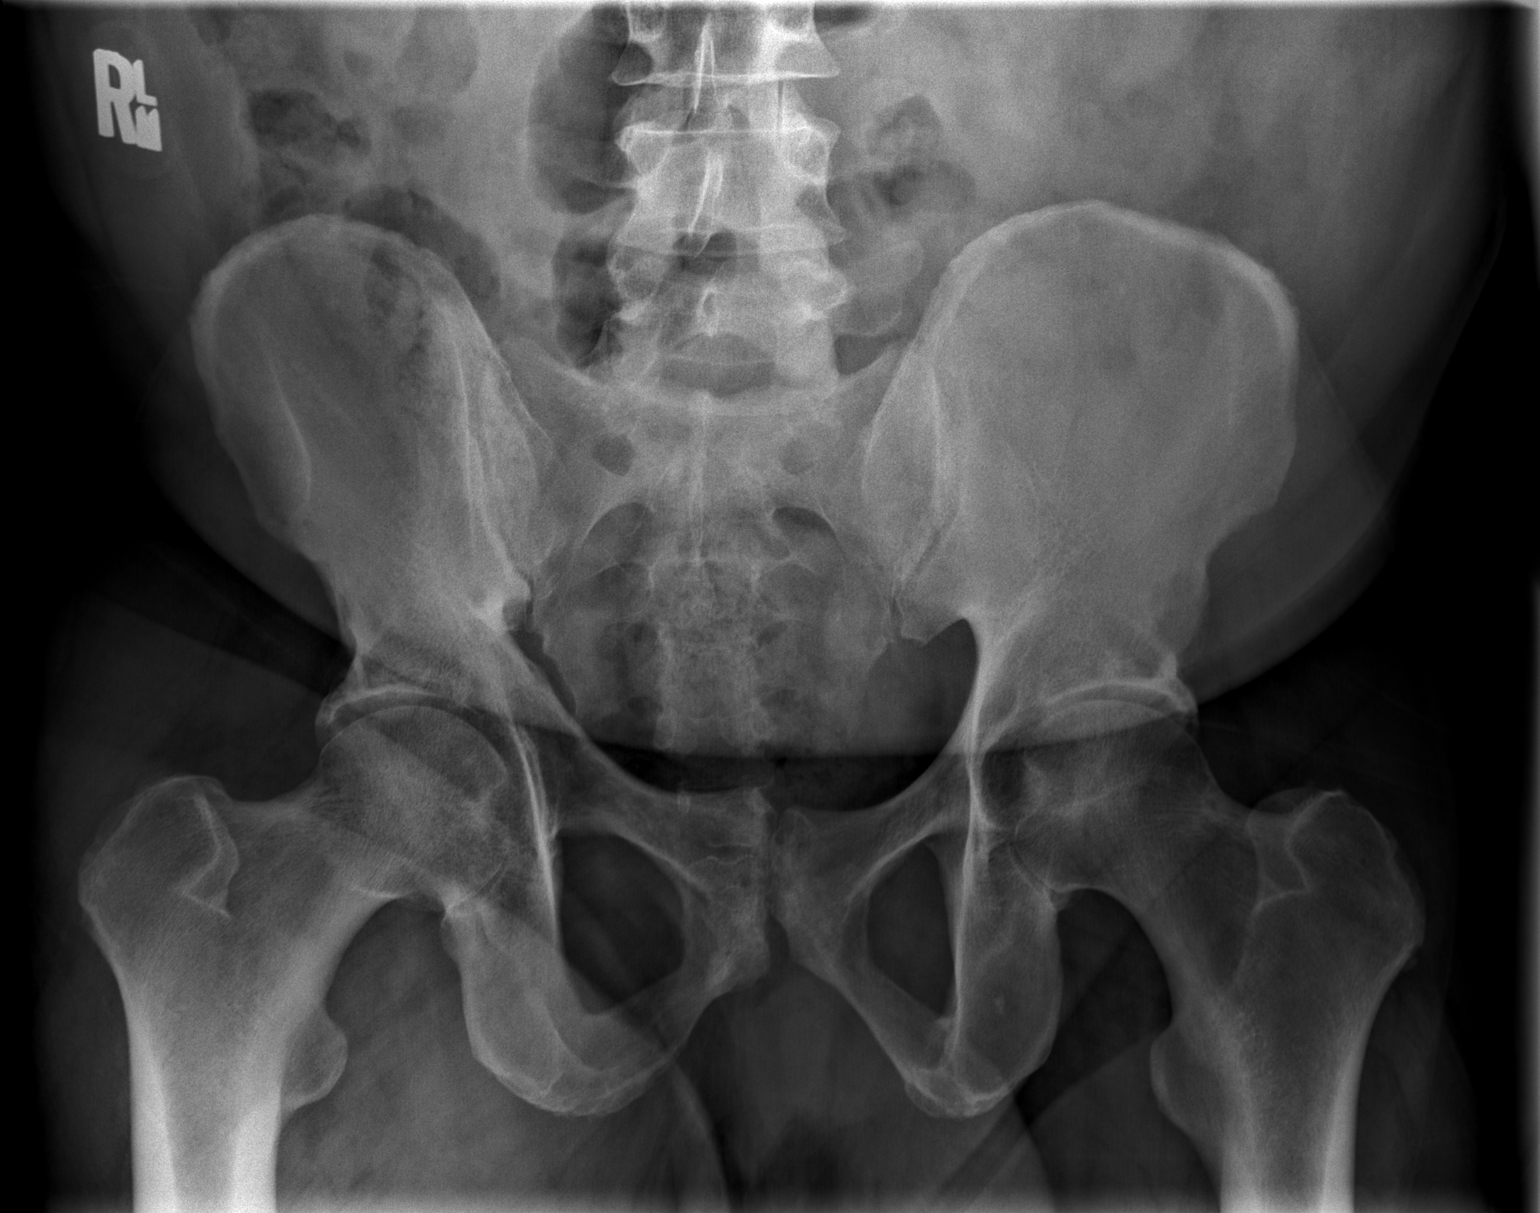

[t hip ap right]
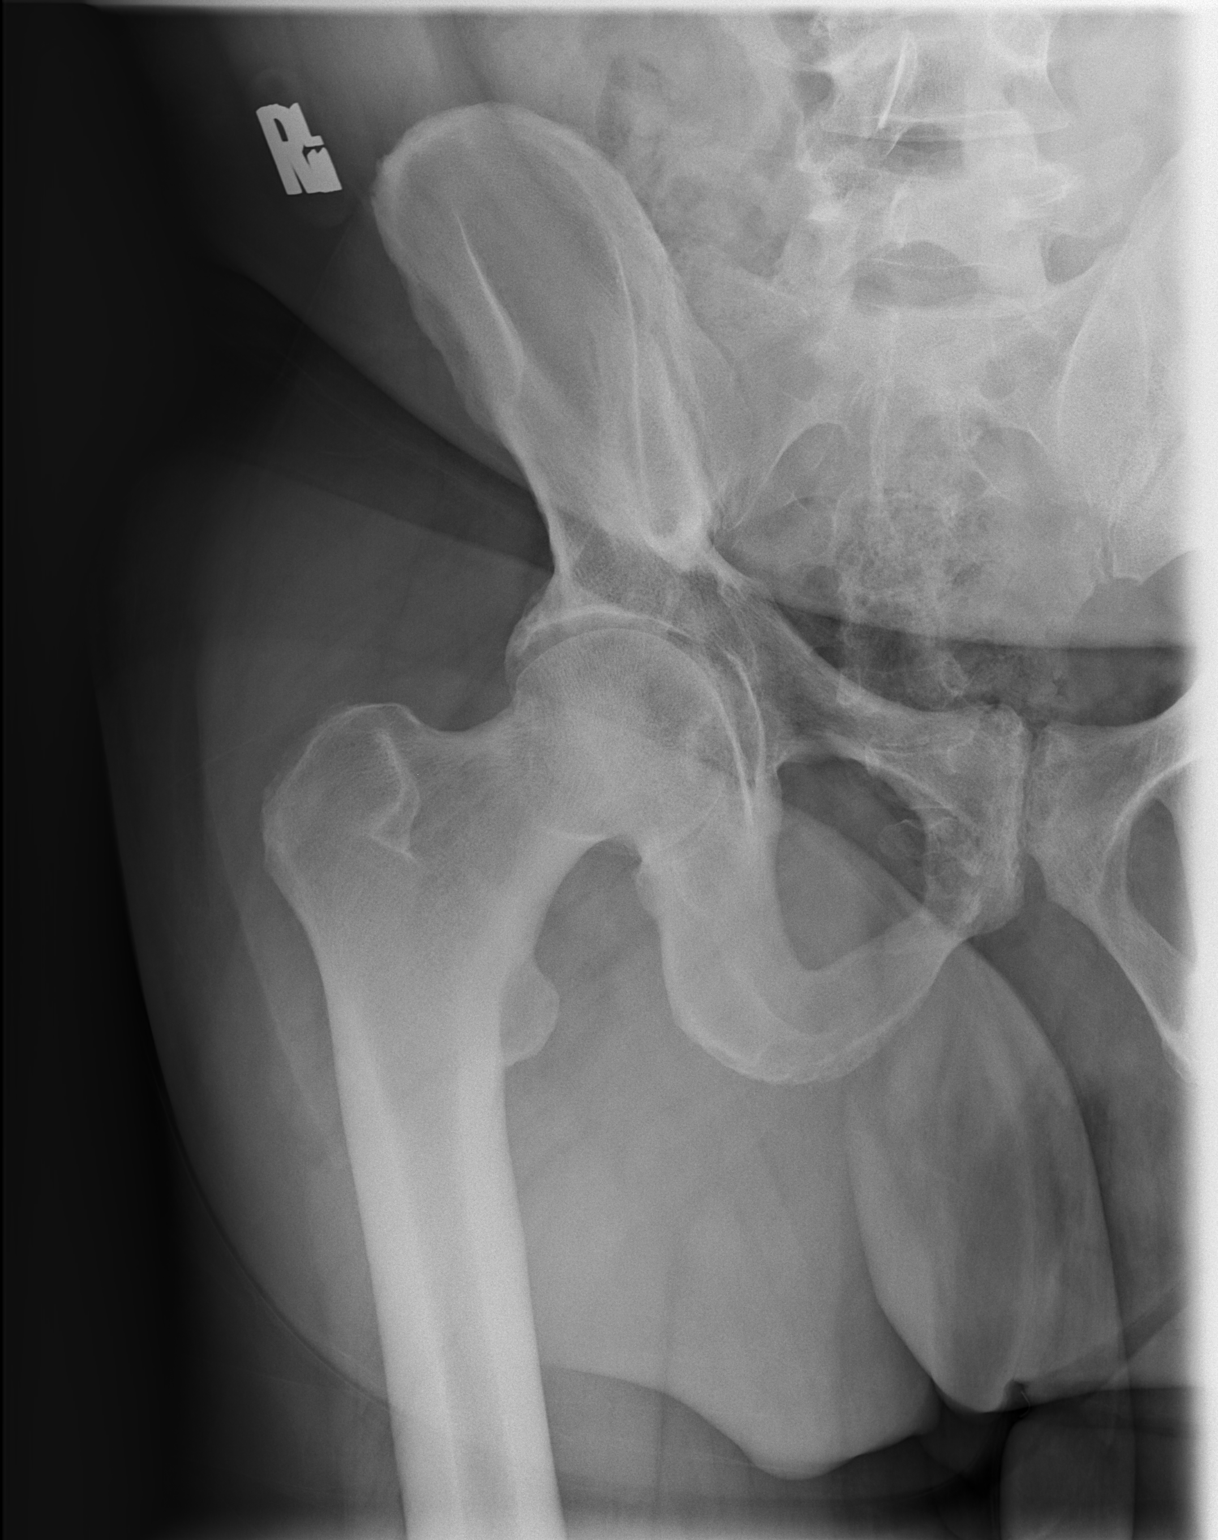

[t hip frog leg right]
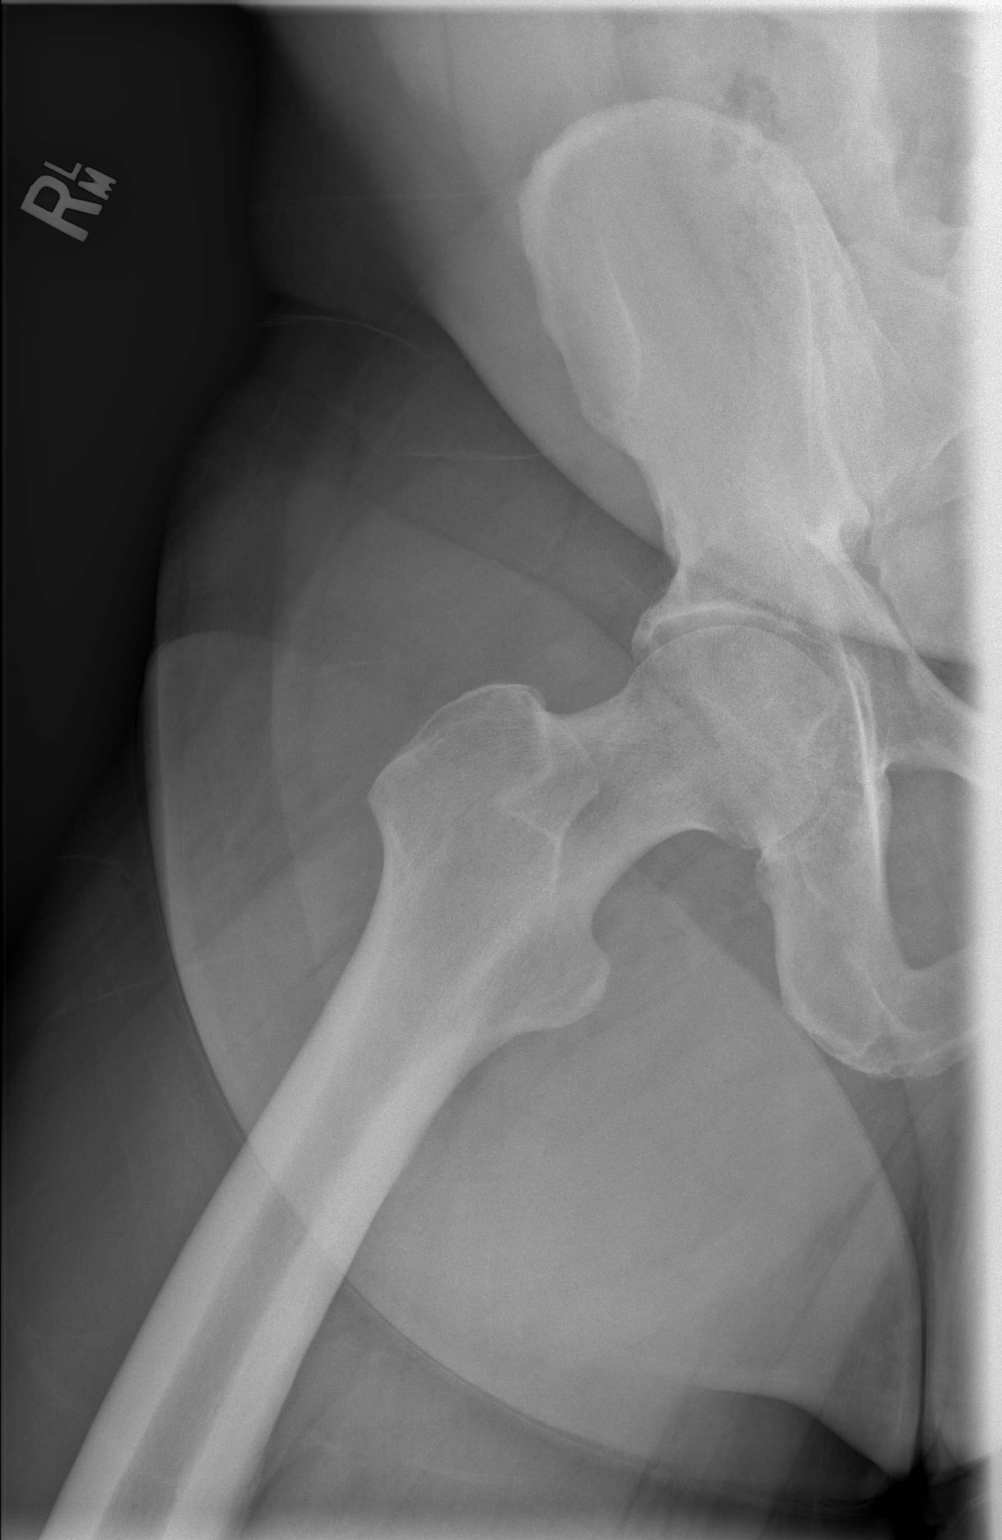

[3 of 3 positions shown; findings below may reference images not displayed]

FINDINGS: Degenerative changes of the hip joints are noted bilaterally. The
pelvic ring is intact. No acute fracture or dislocation is noted. No
soft tissue changes are seen.
IMPRESSION: Degenerative change without acute abnormality.

## 2019-09-07 ENCOUNTER — Other Ambulatory Visit: Payer: Self-pay

## 2019-09-07 DIAGNOSIS — E119 Type 2 diabetes mellitus without complications: Secondary | ICD-10-CM | POA: Insufficient documentation

## 2019-09-07 DIAGNOSIS — Z7984 Long term (current) use of oral hypoglycemic drugs: Secondary | ICD-10-CM | POA: Diagnosis not present

## 2019-09-07 DIAGNOSIS — I10 Essential (primary) hypertension: Secondary | ICD-10-CM | POA: Diagnosis not present

## 2019-09-07 DIAGNOSIS — L089 Local infection of the skin and subcutaneous tissue, unspecified: Secondary | ICD-10-CM | POA: Diagnosis present

## 2019-09-07 DIAGNOSIS — L03011 Cellulitis of right finger: Secondary | ICD-10-CM | POA: Insufficient documentation

## 2019-09-07 DIAGNOSIS — Z79899 Other long term (current) drug therapy: Secondary | ICD-10-CM | POA: Diagnosis not present

## 2019-09-08 ENCOUNTER — Other Ambulatory Visit: Payer: Self-pay

## 2019-09-08 ENCOUNTER — Emergency Department (HOSPITAL_COMMUNITY)
Admission: EM | Admit: 2019-09-08 | Discharge: 2019-09-08 | Disposition: A | Discharge status: Home / Self Care | Payer: Commercial Managed Care - PPO | Attending: Emergency Medicine | Admitting: Emergency Medicine

## 2019-09-08 ENCOUNTER — Encounter (HOSPITAL_COMMUNITY): Payer: Self-pay

## 2019-09-08 DIAGNOSIS — L03011 Cellulitis of right finger: Secondary | ICD-10-CM

## 2019-09-08 LAB — COMPREHENSIVE METABOLIC PANEL
ALT: 54 U/L — ABNORMAL HIGH (ref 0–44)
AST: 39 U/L (ref 15–41)
Albumin: 4.2 g/dL (ref 3.5–5.0)
Alkaline Phosphatase: 76 U/L (ref 38–126)
Anion gap: 12 (ref 5–15)
BUN: 11 mg/dL (ref 6–20)
CO2: 26 mmol/L (ref 22–32)
Calcium: 9.4 mg/dL (ref 8.9–10.3)
Chloride: 101 mmol/L (ref 98–111)
Creatinine, Ser: 0.96 mg/dL (ref 0.61–1.24)
GFR calc Af Amer: >60 mL/min (ref >60)
GFR calc non Af Amer: >60 mL/min (ref >60)
Glucose, Bld: 267 mg/dL — ABNORMAL HIGH (ref 70–99)
Potassium: 4.1 mmol/L (ref 3.5–5.1)
Sodium: 139 mmol/L (ref 135–145)
Total Bilirubin: 0.4 mg/dL (ref 0.3–1.2)
Total Protein: 7.7 g/dL (ref 6.5–8.1)

## 2019-09-08 LAB — CBC WITH DIFFERENTIAL/PLATELET
Abs Immature Granulocytes: 0.05 10*3/uL (ref 0.00–0.07)
Basophils Absolute: 0.1 10*3/uL (ref 0.0–0.1)
Basophils Relative: 1 %
Eosinophils Absolute: 0.3 10*3/uL (ref 0.0–0.5)
Eosinophils Relative: 2 %
HCT: 46.7 % (ref 39.0–52.0)
Hemoglobin: 15.1 g/dL (ref 13.0–17.0)
Immature Granulocytes: 0 %
Lymphocytes Relative: 41 %
Lymphs Abs: 4.6 10*3/uL — ABNORMAL HIGH (ref 0.7–4.0)
MCH: 28.2 pg (ref 26.0–34.0)
MCHC: 32.3 g/dL (ref 30.0–36.0)
MCV: 87.1 fL (ref 80.0–100.0)
Monocytes Absolute: 0.7 10*3/uL (ref 0.1–1.0)
Monocytes Relative: 6 %
Neutro Abs: 5.6 10*3/uL (ref 1.7–7.7)
Neutrophils Relative %: 50 %
Platelets: 201 10*3/uL (ref 150–400)
RBC: 5.36 MIL/uL (ref 4.22–5.81)
RDW: 13 % (ref 11.5–15.5)
WBC: 11.3 10*3/uL — ABNORMAL HIGH (ref 4.0–10.5)
nRBC: 0 % (ref 0.0–0.2)

## 2019-09-08 MED ORDER — OXYCODONE-ACETAMINOPHEN 5-325 MG PO TABS: 1.0000 | ORAL_TABLET | Freq: Three times a day (TID) | ORAL | 0 refills | Status: DC | PRN

## 2019-09-08 MED ORDER — NAPROXEN 500 MG PO TABS: 500.0000 mg | ORAL_TABLET | Freq: Two times a day (BID) | ORAL | 0 refills | Status: DC

## 2019-09-08 MED ORDER — OXYCODONE-ACETAMINOPHEN 5-325 MG PO TABS
1.0000 | ORAL_TABLET | Freq: Once | ORAL | Status: AC
  Administered 2019-09-08: 1 via ORAL
  Filled 2019-09-08: qty 1

## 2019-09-08 NOTE — Discharge Instructions (Signed)
Take the medications to help with pain and swelling. It is important for you to follow-up with the orthopedic hand specialist listed below. Return to the ER for increased redness, weakness, numbness or injuries.

## 2019-09-08 NOTE — ED Provider Notes (Signed)
[facility] DEPT Provider Note   CSN: [id number] Arrival date & time: 09/07/19  2302     History Chief Complaint  Patient presents with  . Wound Infection    Justin Jackson is a 54 y.o. male with a past medical history of diabetes, hypertension presenting to the ED with a chief complaint of dominant right index finger infection.  On 08/31/2019, patient presented to urgent care for 2-week history of right index finger pain and swelling.  He was diagnosed with a felon and a paronychia.  Paronychia was drained and patient was placed on Bactrim which he has been taking.  However despite taking the antibiotics, he continues to have pain in the area of the felon.  He was not given information for follow-up with a specialist.  He has not been taking any medications for pain.  He denies any fevers, chills, increase in swelling, numbness, weakness, shortness of breath, vomiting.  HPI     Past Medical History:  Diagnosis Date  . DM (diabetes mellitus), type 2 ([place])   . Hyperlipidemia   . Hypertension     Patient Active Problem List   Diagnosis Date Noted  . Type II or unspecified type diabetes mellitus without mention of complication, not stated as uncontrolled 07/15/2011  . Hypertension associated with diabetes ([place]) 07/15/2011  . Hyperlipidemia LDL goal <70 07/15/2011  . Obesity (BMI 30-39.9) 07/15/2011    History reviewed. No pertinent surgical history.     No family history on file.  Social History   Tobacco Use  . Smoking status: Never Smoker  Substance Use Topics  . Alcohol use: No  . Drug use: No    Home Medications Prior to Admission medications   Medication Sig Start Date End Date Taking? Authorizing Provider  atorvastatin (LIPITOR) 20 MG tablet Take 1 tablet (20 mg total) by mouth daily. 07/02/12 07/02/13  Denita Lung, MD  gabapentin (NEURONTIN) 100 MG capsule TAKE 1 CAPSULE(100 MG) BY MOUTH THREE TIMES DAILY 01/24/16   Darlyne Russian,  MD  glyBURIDE (DIABETA) 5 MG tablet TAKE 1 TABLET BY MOUTH DAILY WITH BREAKFAST 12/01/12   Denita Lung, MD  HYDROcodone-acetaminophen (NORCO/VICODIN) 5-325 MG tablet Take 2 tablets by mouth every 4 (four) hours as needed. 09/18/15   Darlyne Russian, MD  lisinopril-hydrochlorothiazide (PRINZIDE,ZESTORETIC) 10-12.5 MG per tablet Take 1 tablet by mouth daily. 07/02/12 07/02/13  Denita Lung, MD  lisinopril-hydrochlorothiazide (PRINZIDE,ZESTORETIC) 10-12.5 MG tablet Take 1 tablet by mouth daily.    [provider]  meloxicam (MOBIC) 15 MG tablet Take 1 tablet (15 mg total) by mouth daily. 09/18/15   Darlyne Russian, MD  metFORMIN (GLUCOPHAGE) 1000 MG tablet Take 1,000 mg by mouth 2 (two) times daily with a meal.    [provider]  naproxen (NAPROSYN) 500 MG tablet Take 1 tablet (500 mg total) by mouth 2 (two) times daily. 09/08/19   Dracen Reigle, PA-C  oxyCODONE-acetaminophen (PERCOCET/ROXICET) 5-325 MG tablet Take 1 tablet by mouth every 8 (eight) hours as needed for severe pain. 09/08/19   Shemia Bevel, PA-C  pioglitazone (ACTOS) 30 MG tablet Take 1 tablet (30 mg total) by mouth daily. (PATIENT IS DUE FOR DUE FOR DIABETES CHECK [phone]) 02/16/13   Denita Lung, MD  Saxagliptin-Metformin 2.08-998 MG TB24 Take 2 capsules by mouth 1 day or 1 dose. Patient not taking: Reported on 08/31/2015 10/11/12   Denita Lung, MD    Allergies    Patient has no known  allergies.  Review of Systems   Review of Systems  Constitutional: Negative for appetite change, chills and fever.  HENT: Negative for ear pain, rhinorrhea, sneezing and sore throat.   Eyes: Negative for photophobia and visual disturbance.  Respiratory: Negative for cough, chest tightness, shortness of breath and wheezing.   Cardiovascular: Negative for chest pain and palpitations.  Gastrointestinal: Negative for abdominal pain, blood in stool, constipation, diarrhea, nausea and vomiting.  Genitourinary: Negative for dysuria,  hematuria and urgency.  Musculoskeletal: Negative for myalgias.  Skin: Positive for wound. Negative for rash.  Neurological: Negative for dizziness, weakness and light-headedness.    Physical Exam Updated Vital Signs BP 131/88   Pulse (!) 104   Temp 97.7 F (36.5 C)   Resp 18   Ht 6\' 3"  (1.905 m)   Wt 120.2 kg   SpO2 98%   BMI 33.12 kg/m   Physical Exam Vitals and nursing note reviewed.  Constitutional:      General: He is not in acute distress.    Appearance: He is well-developed.  HENT:     Head: Normocephalic and atraumatic.     Nose: Nose normal.  Eyes:     General: No scleral icterus.       Left eye: No discharge.     Conjunctiva/sclera: Conjunctivae normal.  Cardiovascular:     Rate and Rhythm: Normal rate and regular rhythm.     Heart sounds: Normal heart sounds. No murmur. No friction rub. No gallop.   Pulmonary:     Effort: Pulmonary effort is normal. No respiratory distress.     Breath sounds: Normal breath sounds.  Abdominal:     General: Bowel sounds are normal. There is no distension.     Palpations: Abdomen is soft.     Tenderness: There is no abdominal tenderness. There is no guarding.  Musculoskeletal:        General: Normal range of motion.     Cervical back: Normal range of motion and neck supple.     Comments: Mild induration of the right 2nd digit finger pad as noted in image. No specific area of fluctuance. Small area of pus noted over crease. 2+ radial pulse palpated bilaterally.  Normal sensation to light touch.  Skin:    General: Skin is warm and dry.     Findings: No rash.  Neurological:     Mental Status: He is alert.     Motor: No abnormal muscle tone.     Coordination: Coordination normal.           ED Results / Procedures / Treatments   Labs (all labs ordered are listed, but only abnormal results are displayed) Labs Reviewed  COMPREHENSIVE METABOLIC PANEL - Abnormal; Notable for the following components:      Result Value     Glucose, Bld 267 (*)    ALT 54 (*)    All other components within normal limits  CBC WITH DIFFERENTIAL/PLATELET - Abnormal; Notable for the following components:   WBC 11.3 (*)    Lymphs Abs 4.6 (*)    All other components within normal limits    EKG None  Radiology No results found.  Procedures Procedures (including critical care time)  Medications Ordered in ED Medications  oxyCODONE-acetaminophen (PERCOCET/ROXICET) 5-325 MG per tablet 1 tablet (1 tablet Oral Given 09/08/19 0420)    ED Course  I have reviewed the triage vital signs and the nursing notes.  Pertinent labs & imaging results that were available during my  care of the patient were reviewed by me and considered in my medical decision making (see chart for details).    MDM Rules/Calculators/A&P                      54 year old male with past medical history of diabetes presenting to the ED with right index finger infection.  States that he had a paronychia drained 1 week ago and was placed on Bactrim.  Since then he has had persistent swelling on the palmar side of his right index finger.  He was told he may need to come to the ER for drainage of a felon.  He has been taking his Bactrim as prescribed.  He states that the paronychia continued to drain. On exam there is no obvious felon noted. No drainage noted.  Feel that he will benefit from continued antibiotic treatment and hand specialist follow-up.  We will give him a short course of pain medication and give him strict return precautions.  All imaging, if done today, including plain films, CT scans, and ultrasounds, independently reviewed by me, and interpretations confirmed via formal radiology reads.  Patient is hemodynamically stable, in NAD, and able to ambulate in the ED. Evaluation does not show pathology that would require ongoing emergent intervention or inpatient treatment. I explained the diagnosis to the patient. Pain has been managed and has no  complaints prior to discharge. Patient is comfortable with above plan and is stable for discharge at this time. All questions were answered prior to disposition. Strict return precautions for returning to the ED were discussed. Encouraged follow up with PCP.   Prior to providing a prescription for a controlled substance, I independently reviewed the patient's recent prescription history on the [place] Controlled Substance Reporting System. The patient had no recent or regular prescriptions and was deemed appropriate for a brief, less than 3 day prescription of narcotic for acute analgesia.  An After Visit Summary was printed and given to the patient.   Portions of this note were generated with Scientist, clinical (histocompatibility and immunogenetics). Dictation errors may occur despite best attempts at proofreading.  Final Clinical Impression(s) / ED Diagnoses Final diagnoses:  Cellulitis of finger of right hand    Rx / DC Orders ED Discharge Orders         Ordered    oxyCODONE-acetaminophen (PERCOCET/ROXICET) 5-325 MG tablet  Every 8 hours PRN     09/08/19 0503    naproxen (NAPROSYN) 500 MG tablet  2 times daily     09/08/19 0503           Dietrich Pates, PA-C 09/08/19 0503    Zadie Rhine, MD 09/08/19 [phone]

## 2019-09-08 NOTE — ED Triage Notes (Signed)
Per Pt Pt went to urgent care for infection in left pointer finger apprx one week ago. They performed I&D, and started pt on abx. Since then the infection has only become worse. Hx of diabetes.

## 2022-12-08 ENCOUNTER — Ambulatory Visit (INDEPENDENT_AMBULATORY_CARE_PROVIDER_SITE_OTHER): Payer: Commercial Managed Care - PPO | Admitting: Nurse Practitioner

## 2022-12-08 ENCOUNTER — Encounter: Payer: Self-pay | Admitting: Nurse Practitioner

## 2022-12-08 VITALS — BP 124/80 | HR 104 | Temp 98.0°F | Ht 73.0 in | Wt 278.8 lb

## 2022-12-08 DIAGNOSIS — Z23 Encounter for immunization: Secondary | ICD-10-CM

## 2022-12-08 DIAGNOSIS — E785 Hyperlipidemia, unspecified: Secondary | ICD-10-CM | POA: Diagnosis not present

## 2022-12-08 DIAGNOSIS — E669 Obesity, unspecified: Secondary | ICD-10-CM

## 2022-12-08 DIAGNOSIS — Z125 Encounter for screening for malignant neoplasm of prostate: Secondary | ICD-10-CM

## 2022-12-08 DIAGNOSIS — E1165 Type 2 diabetes mellitus with hyperglycemia: Secondary | ICD-10-CM | POA: Diagnosis not present

## 2022-12-08 DIAGNOSIS — Z7984 Long term (current) use of oral hypoglycemic drugs: Secondary | ICD-10-CM

## 2022-12-08 DIAGNOSIS — I152 Hypertension secondary to endocrine disorders: Secondary | ICD-10-CM

## 2022-12-08 DIAGNOSIS — Z0000 Encounter for general adult medical examination without abnormal findings: Secondary | ICD-10-CM | POA: Diagnosis not present

## 2022-12-08 DIAGNOSIS — E1159 Type 2 diabetes mellitus with other circulatory complications: Secondary | ICD-10-CM | POA: Diagnosis not present

## 2022-12-08 DIAGNOSIS — Z1211 Encounter for screening for malignant neoplasm of colon: Secondary | ICD-10-CM

## 2022-12-08 LAB — LIPID PANEL
Cholesterol: 101 mg/dL (ref 0–200)
HDL: 57.2 mg/dL (ref >39.00)
LDL Cholesterol: 28 mg/dL (ref 0–99)
NonHDL: 44
Total CHOL/HDL Ratio: 2
Triglycerides: 81 mg/dL (ref 0.0–149.0)
VLDL: 16.2 mg/dL (ref 0.0–40.0)

## 2022-12-08 LAB — MICROALBUMIN / CREATININE URINE RATIO
Creatinine,U: 112.1 mg/dL
Microalb Creat Ratio: 0.9 mg/g (ref 0.0–30.0)
Microalb, Ur: 1 mg/dL (ref 0.0–1.9)

## 2022-12-08 LAB — COMPREHENSIVE METABOLIC PANEL
ALT: 24 U/L (ref 0–53)
AST: 20 U/L (ref 0–37)
Albumin: 4.5 g/dL (ref 3.5–5.2)
Alkaline Phosphatase: 52 U/L (ref 39–117)
BUN: 9 mg/dL (ref 6–23)
CO2: 31 meq/L (ref 19–32)
Calcium: 9.9 mg/dL (ref 8.4–10.5)
Chloride: 99 meq/L (ref 96–112)
Creatinine, Ser: 1.03 mg/dL (ref 0.40–1.50)
GFR: 82.23 mL/min (ref >60.00)
Glucose, Bld: 164 mg/dL — ABNORMAL HIGH (ref 70–99)
Potassium: 4.2 meq/L (ref 3.5–5.1)
Sodium: 139 meq/L (ref 135–145)
Total Bilirubin: 0.8 mg/dL (ref 0.2–1.2)
Total Protein: 7.5 g/dL (ref 6.0–8.3)

## 2022-12-08 LAB — CBC
HCT: 49.4 % (ref 39.0–52.0)
Hemoglobin: 15.7 g/dL (ref 13.0–17.0)
MCHC: 31.7 g/dL (ref 30.0–36.0)
MCV: 87.6 fl (ref 78.0–100.0)
Platelets: 162 10*3/uL (ref 150.0–400.0)
RBC: 5.64 Mil/uL (ref 4.22–5.81)
RDW: 14 % (ref 11.5–15.5)
WBC: 9.9 10*3/uL (ref 4.0–10.5)

## 2022-12-08 LAB — POCT GLYCOSYLATED HEMOGLOBIN (HGB A1C): Hemoglobin A1C: 10.8 % — AB (ref 4.0–5.6)

## 2022-12-08 LAB — TSH: TSH: 1.88 u[IU]/mL (ref 0.35–5.50)

## 2022-12-08 LAB — PSA: PSA: 1.08 ng/mL (ref 0.10–4.00)

## 2022-12-08 MED ORDER — GLIPIZIDE 5 MG PO TABS: 5.0000 mg | ORAL_TABLET | Freq: Two times a day (BID) | ORAL | 3 refills | Status: DC

## 2022-12-08 NOTE — Progress Notes (Signed)
New Patient Office Visit  Subjective    Patient ID: Dennes Volkov, male    DOB: September 17, 1965  Age: 57 y.o. MRN: [id number]  CC:  Chief Complaint  Patient presents with   Establish Care    Pt requests physical for his insurance.     HPI Tamarcus Droll presents to establish care   DM2: states that he checks his suagr 3 times a week States that he is getting 180-250 Patient currently maintained on Farxiga 10 mg, glyburide 5 mg, metformin 1000 mg twice daily. 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  for complete physical and follow up of chronic conditions.  Immunizations: -Tetanus: Completed in 2022 -Influenza: update today  -Shingles: Discussed in office -Pneumonia: unsure thanks has been within 5 years  Diet: Fair diet. States that he will do 2-3 meals with snacks. States that he will drink water, tea, soft drink Exercise:  States that he will work out on the truck 3-4 nights with dumbbells  Eye exam: Completes annually. No corrective lense  Dental exam: Completes semi-annually    Colonoscopy: Has not done ambulatory referral to [facility] Lung Cancer Screening: [name]  PSA: Due  Sleep; states that it varies. Does not snore. Mostly feels rested.   Outpatient Encounter Medications as of 12/08/2022  Medication Sig   FARXIGA 10 MG TABS tablet Take 10 mg by mouth daily.   gabapentin (NEURONTIN) 600 MG tablet Take 600 mg by mouth at bedtime.   glipiZIDE (GLUCOTROL) 5 MG tablet Take 1 tablet (5 mg total) by mouth 2 (two) times daily before a meal.   lisinopril (ZESTRIL) 5 MG tablet Take 5 mg by mouth daily.   metFORMIN (GLUCOPHAGE) 1000 MG tablet Take 1,000 mg  by mouth 2 (two) times daily with a meal.   rosuvastatin (CRESTOR) 20 MG tablet Take 20 mg by mouth daily.   [DISCONTINUED] glyBURIDE (DIABETA) 5 MG tablet TAKE 1 TABLET BY MOUTH DAILY WITH BREAKFAST   naproxen (NAPROSYN) 500 MG tablet Take 1 tablet (500 mg total) by mouth 2 (two) times daily. (Patient not taking: Reported on 12/08/2022)   [DISCONTINUED] atorvastatin (LIPITOR) 20 MG tablet Take 1 tablet (20 mg total) by mouth daily.   [DISCONTINUED] gabapentin (NEURONTIN) 100 MG capsule TAKE 1 CAPSULE(100 MG) BY MOUTH THREE TIMES DAILY   [DISCONTINUED] HYDROcodone-acetaminophen (NORCO/VICODIN) 5-325 MG tablet Take 2 tablets by mouth every 4 (four) hours as needed.   [DISCONTINUED] lisinopril-hydrochlorothiazide (PRINZIDE,ZESTORETIC) 10-12.5 MG per tablet Take 1 tablet by mouth daily.   [DISCONTINUED] lisinopril-hydrochlorothiazide (PRINZIDE,ZESTORETIC) 10-12.5 MG tablet Take 1 tablet by mouth daily. (Patient not taking: Reported on 12/08/2022)   [DISCONTINUED] meloxicam (MOBIC) 15 MG tablet Take 1 tablet (15 mg total) by mouth daily.   [DISCONTINUED] oxyCODONE-acetaminophen (PERCOCET/ROXICET) 5-325 MG tablet Take 1 tablet by mouth every 8 (eight) hours as needed for severe pain.   [DISCONTINUED] pioglitazone (ACTOS) 30 MG tablet Take 1 tablet (30 mg total) by mouth daily. (PATIENT IS DUE FOR DUE FOR DIABETES CHECK [phone])   [DISCONTINUED] Saxagliptin-Metformin 2.08-998 MG TB24 Take 2 capsules by mouth 1 day or 1 dose.   No facility-administered encounter medications on file as of 12/08/2022.  Past Medical History:  Diagnosis Date   DM (diabetes mellitus), type 2 (HCC)    Hyperlipidemia    Hypertension     History reviewed. No pertinent surgical history.  Family History  Problem Relation Age of Onset   Diabetes Mother     Social History   Socioeconomic History   Marital status: Divorced    Spouse name: Lorene Dy   Number of children: 2   Years of education: Not on file   Highest  education level: Not on file  Occupational History   Not on file  Tobacco Use   Smoking status: Never   Smokeless tobacco: Not on file  Vaping Use   Vaping status: Never Used  Substance and Sexual Activity   Alcohol use: No   Drug use: No   Sexual activity: Not on file  Other Topics Concern   Not on file  Social History Narrative   Fulltime: self employeed       Koby (27)   Conjana (30)   Social Determinants of Health   Financial Resource Strain: Not on file  Food Insecurity: Not on file  Transportation Needs: Not on file  Physical Activity: Not on file  Stress: Not on file  Social Connections: Not on file  Intimate Partner Violence: Not on file    Review of Systems  Constitutional:  Negative for chills and fever.  Respiratory:  Negative for shortness of breath.   Cardiovascular:  Negative for chest pain and leg swelling.  Gastrointestinal:  Negative for abdominal pain, blood in stool, constipation, diarrhea, nausea and vomiting.       BM daily   Genitourinary:  Negative for dysuria and hematuria.  Neurological:  Positive for tingling. Negative for headaches.  Psychiatric/Behavioral:  Negative for hallucinations and suicidal ideas.         Objective    BP 124/80   Pulse (!) 104   Temp 98 F (36.7 C) (Temporal)   Ht 6\' 1"  (1.854 m)   Wt 278 lb 12.8 oz (126.5 kg)   SpO2 98%   BMI 36.78 kg/m   Physical Exam Vitals and nursing note reviewed.  Constitutional:      Appearance: Normal appearance.  HENT:     Right Ear: Tympanic membrane, ear canal and external ear normal.     Left Ear: Tympanic membrane, ear canal and external ear normal.     Mouth/Throat:     Mouth: Mucous membranes are moist.     Pharynx: Oropharynx is clear.  Eyes:     Extraocular Movements: Extraocular movements intact.     Pupils: Pupils are equal, round, and reactive to light.  Cardiovascular:     Rate and Rhythm: Normal rate and regular rhythm.     Pulses: Normal pulses.      Heart sounds: Normal heart sounds.  Pulmonary:     Effort: Pulmonary effort is normal.     Breath sounds: Normal breath sounds.  Abdominal:     General: Bowel sounds are normal. There is no distension.     Palpations: There is no mass.     Tenderness: There is no abdominal tenderness.     Hernia: No hernia is present.  Musculoskeletal:     Right lower leg: No edema.     Left lower leg: No edema.  Lymphadenopathy:     Cervical: No cervical adenopathy.  Skin:    General: Skin is warm.  Neurological:     General: No focal deficit present.  Mental Status: He is alert.     Deep Tendon Reflexes:     Reflex Scores:      Bicep reflexes are 2+ on the right side and 2+ on the left side.      Patellar reflexes are 2+ on the right side and 2+ on the left side.    Comments: Bilateral upper and lower extremity strength 5/5  Psychiatric:        Mood and Affect: Mood normal.        Behavior: Behavior normal.        Thought Content: Thought content normal.        Judgment: Judgment normal.         Assessment & Plan:   Problem List Items Addressed This Visit       Cardiovascular and Mediastinum   Hypertension associated with diabetes (HCC)    Patient's blood pressure well-controlled he is on lisinopril 5 mg for hypertension and renal protection.  Continue medication as prescribed pending labs today      Relevant Medications   FARXIGA 10 MG TABS tablet   lisinopril (ZESTRIL) 5 MG tablet   rosuvastatin (CRESTOR) 20 MG tablet   glipiZIDE (GLUCOTROL) 5 MG tablet     Endocrine   Uncontrolled type 2 diabetes mellitus with hyperglycemia (HCC)    Uncontrolled diabetes.  Will change from glyburide to glipizide 5 mg twice daily continue metformin 1000 mg twice daily continue Farxiga 10 mg daily.  If no good improvement we can increase glipizide next office visit if still needing help consider GLP-1 receptor agonist at that point      Relevant Medications   FARXIGA 10 MG TABS tablet    lisinopril (ZESTRIL) 5 MG tablet   rosuvastatin (CRESTOR) 20 MG tablet   glipiZIDE (GLUCOTROL) 5 MG tablet   Other Relevant Orders   POCT glycosylated hemoglobin (Hb A1C) (Completed)   CBC   Comprehensive metabolic panel   Lipid panel   Microalbumin / creatinine urine ratio     Other   Hyperlipidemia LDL goal <70   Relevant Medications   lisinopril (ZESTRIL) 5 MG tablet   rosuvastatin (CRESTOR) 20 MG tablet   Other Relevant Orders   Lipid panel   Obesity (BMI 30-39.9)    Pending TSH A1c and lipid panel.  Continue working on healthy lifestyle modifications      Relevant Medications   FARXIGA 10 MG TABS tablet   glipiZIDE (GLUCOTROL) 5 MG tablet   Preventative health care - Primary    Discussed age-appropriate immunizations and screening exams.  Did review patient's personal, surgical, social, family histories.  Patient is up-to-date on all age-appropriate vaccinations he would like.  Update flu vaccine today.  Ambulatory referral to GI today for CRC screening.  PSA for prostate cancer screening today.  Patient was given information at discharge about preventative healthcare maintenance with anticipatory guidance. Patient had a form from his wife's employer's insurance that I did fill out complete retained a copy for the office and gave him the original in office today.      Relevant Orders   CBC   Comprehensive metabolic panel   TSH   Other Visit Diagnoses     Screening for prostate cancer       Relevant Orders   PSA   Screening for colon cancer       Relevant Orders   Ambulatory referral to Gastroenterology   Need for influenza vaccination       Relevant Orders  Flu vaccine trivalent PF, 6mos and older(Flulaval,Afluria,Fluarix,Fluzone) (Completed)       Return in about 3 months (around 03/09/2023) for DM recheck.   Audria Nine, NP

## 2022-12-08 NOTE — Assessment & Plan Note (Signed)
Pending TSH A1c and lipid panel.  Continue working on healthy lifestyle modifications

## 2022-12-08 NOTE — Assessment & Plan Note (Signed)
Discussed age-appropriate immunizations and screening exams.  Did review patient's personal, surgical, social, family histories.  Patient is up-to-date on all age-appropriate vaccinations he would like.  Update flu vaccine today.  Ambulatory referral to GI today for CRC screening.  PSA for prostate cancer screening today.  Patient was given information at discharge about preventative healthcare maintenance with anticipatory guidance. Patient had a form from his wife's employer's insurance that I did fill out complete retained a copy for the office and gave him the original in office today.

## 2022-12-08 NOTE — Patient Instructions (Signed)
Nice to see you today I will be in touch with the labs once I have them We did update your flu vaccine today Follow up with mein 3 months for a diabetes recheck

## 2022-12-08 NOTE — Assessment & Plan Note (Signed)
Uncontrolled diabetes.  Will change from glyburide to glipizide 5 mg twice daily continue metformin 1000 mg twice daily continue Farxiga 10 mg daily.  If no good improvement we can increase glipizide next office visit if still needing help consider GLP-1 receptor agonist at that point

## 2022-12-08 NOTE — Assessment & Plan Note (Signed)
Patient's blood pressure well-controlled he is on lisinopril 5 mg for hypertension and renal protection.  Continue medication as prescribed pending labs today

## 2022-12-09 ENCOUNTER — Other Ambulatory Visit: Payer: Self-pay

## 2022-12-09 ENCOUNTER — Telehealth: Payer: Self-pay

## 2022-12-09 DIAGNOSIS — Z1211 Encounter for screening for malignant neoplasm of colon: Secondary | ICD-10-CM

## 2022-12-09 MED ORDER — NA SULFATE-K SULFATE-MG SULF 17.5-3.13-1.6 GM/177ML PO SOLN
1.0000 | Freq: Once | ORAL | 0 refills | Status: AC
Start: 2022-12-09 — End: 2022-12-09

## 2022-12-09 NOTE — Telephone Encounter (Signed)
Gastroenterology Pre-Procedure Review  Request Date: 01/27/23 Requesting Physician: Dr. Allegra Lai  PATIENT REVIEW QUESTIONS: The patient responded to the following health history questions as indicated:    1. Are you having any GI issues? no 2. Do you have a personal history of Polyps? no 3. Do you have a family history of Colon Cancer or Polyps? no 4. Diabetes Mellitus? yes Justin Jackson stop 3 days before colonoscopy on 10/22.  Glipizide and Glyburide stop 1 day prior to colonoscopy on 10/24. Metformin stop 2 days prior to colonoscopy on 10/23.) 5. Joint replacements in the past 12 months?no 6. Major health problems in the past 3 months?no 7. Any artificial heart valves, MVP, or defibrillator?no    MEDICATIONS & ALLERGIES:    Patient reports the following regarding taking any anticoagulation/antiplatelet therapy:   Plavix, Coumadin, Eliquis, Xarelto, Lovenox, Pradaxa, Brilinta, or Effient? no Aspirin? no  Patient confirms/reports the following medications:  Current Outpatient Medications  Medication Sig Dispense Refill   FARXIGA 10 MG TABS tablet Take 10 mg by mouth daily.     gabapentin (NEURONTIN) 600 MG tablet Take 600 mg by mouth at bedtime.     glipiZIDE (GLUCOTROL) 5 MG tablet Take 1 tablet (5 mg total) by mouth 2 (two) times daily before a meal. 60 tablet 3   glyBURIDE (DIABETA) 5 MG tablet Take 1 tablet by mouth 2 (two) times daily.     lisinopril (ZESTRIL) 5 MG tablet Take 5 mg by mouth daily.     metFORMIN (GLUCOPHAGE) 1000 MG tablet Take 1,000 mg by mouth 2 (two) times daily with a meal.     rosuvastatin (CRESTOR) 20 MG tablet Take 20 mg by mouth daily.     No current facility-administered medications for this visit.    Patient confirms/reports the following allergies:  No Known Allergies  No orders of the defined types were placed in this encounter.   AUTHORIZATION INFORMATION Primary Insurance: 1D#: Group #:  Secondary Insurance: 1D#: Group #:  SCHEDULE  INFORMATION: Date: 01/27/23 Time: Location: [facility]

## 2023-01-09 ENCOUNTER — Telehealth: Payer: Self-pay | Admitting: Nurse Practitioner

## 2023-01-09 DIAGNOSIS — E785 Hyperlipidemia, unspecified: Secondary | ICD-10-CM

## 2023-01-09 DIAGNOSIS — E1165 Type 2 diabetes mellitus with hyperglycemia: Secondary | ICD-10-CM

## 2023-01-09 DIAGNOSIS — E1159 Type 2 diabetes mellitus with other circulatory complications: Secondary | ICD-10-CM

## 2023-01-09 DIAGNOSIS — G629 Polyneuropathy, unspecified: Secondary | ICD-10-CM

## 2023-01-09 NOTE — Telephone Encounter (Signed)
Prescription Request  01/09/2023  LOV: 12/08/2022  What is the name of the medication or equipment? rosuvastatin (CRESTOR) 20 MG tablet metFORMIN (GLUCOPHAGE) 1000 MG tablet  lisinopril (ZESTRIL) 5 MG tablet glyBURIDE (DIABETA) 5 MG tablet gabapentin (NEURONTIN) 600 MG tablet FARXIGA 10 MG TABS tablet  Have you contacted your pharmacy to request a refill? No   Which pharmacy would you like this sent to?   [id number] [place], [place] - [address] AT [facility] OF [address] [place] [address] Phone: [phone] Fax: [phone]    Patient notified that their request is being sent to the clinical staff for review and that they should receive a response within 2 business days.   Please advise at [facility] [phone].

## 2023-01-10 NOTE — Telephone Encounter (Signed)
LAST APPOINTMENT DATE: 12/08/2022   NEXT APPOINTMENT DATE: 03/09/2023   Metformin 1000mg  LAST REFILL: 08/31/2015 QTY: unknown   Lisinopril 5mg  LAST REFILL: 12/08/22 [id number]  Gabapentin 600mg  (one refill remaining from June)  LAST REFILL: 09/30/22 QTY: unknown

## 2023-01-11 MED ORDER — METFORMIN HCL 1000 MG PO TABS
1000.0000 mg | ORAL_TABLET | Freq: Two times a day (BID) | ORAL | 1 refills | Status: AC
Start: 2023-01-11 — End: ?

## 2023-01-11 MED ORDER — LISINOPRIL 5 MG PO TABS: 5.0000 mg | ORAL_TABLET | Freq: Every day | ORAL | 1 refills | Status: DC

## 2023-01-11 MED ORDER — GABAPENTIN 600 MG PO TABS
600.0000 mg | ORAL_TABLET | Freq: Every day | ORAL | 0 refills | Status: DC
Start: 2023-01-11 — End: 2023-03-21

## 2023-01-11 NOTE — Telephone Encounter (Signed)
Refills provided 

## 2023-01-27 ENCOUNTER — Ambulatory Visit
Admission: RE | Admit: 2023-01-27 | Discharge: 2023-01-27 | Disposition: A | Discharge status: Home / Self Care | Payer: Commercial Managed Care - PPO | Attending: Gastroenterology | Admitting: Gastroenterology

## 2023-01-27 ENCOUNTER — Encounter
Admission: RE | Disposition: A | Discharge status: Home / Self Care | Payer: Self-pay | Source: Home / Self Care | Attending: Gastroenterology

## 2023-01-27 ENCOUNTER — Other Ambulatory Visit: Payer: Self-pay

## 2023-01-27 ENCOUNTER — Ambulatory Visit: Payer: Commercial Managed Care - PPO | Admitting: Anesthesiology

## 2023-01-27 ENCOUNTER — Encounter: Payer: Self-pay | Admitting: Gastroenterology

## 2023-01-27 DIAGNOSIS — D122 Benign neoplasm of ascending colon: Secondary | ICD-10-CM | POA: Diagnosis not present

## 2023-01-27 DIAGNOSIS — D123 Benign neoplasm of transverse colon: Secondary | ICD-10-CM

## 2023-01-27 DIAGNOSIS — I10 Essential (primary) hypertension: Secondary | ICD-10-CM | POA: Insufficient documentation

## 2023-01-27 DIAGNOSIS — K644 Residual hemorrhoidal skin tags: Secondary | ICD-10-CM | POA: Diagnosis not present

## 2023-01-27 DIAGNOSIS — E785 Hyperlipidemia, unspecified: Secondary | ICD-10-CM | POA: Insufficient documentation

## 2023-01-27 DIAGNOSIS — Z79899 Other long term (current) drug therapy: Secondary | ICD-10-CM | POA: Insufficient documentation

## 2023-01-27 DIAGNOSIS — E119 Type 2 diabetes mellitus without complications: Secondary | ICD-10-CM | POA: Diagnosis not present

## 2023-01-27 DIAGNOSIS — Z7984 Long term (current) use of oral hypoglycemic drugs: Secondary | ICD-10-CM | POA: Diagnosis not present

## 2023-01-27 DIAGNOSIS — K635 Polyp of colon: Secondary | ICD-10-CM

## 2023-01-27 DIAGNOSIS — Z1211 Encounter for screening for malignant neoplasm of colon: Secondary | ICD-10-CM | POA: Diagnosis not present

## 2023-01-27 HISTORY — PX: COLONOSCOPY WITH PROPOFOL: SHX5780

## 2023-01-27 LAB — GLUCOSE, CAPILLARY: Glucose-Capillary: 315 mg/dL — ABNORMAL HIGH (ref 70–99)

## 2023-01-27 SURGERY — COLONOSCOPY WITH PROPOFOL
Anesthesia: General

## 2023-01-27 MED ORDER — PROPOFOL 10 MG/ML IV BOLUS
INTRAVENOUS | Status: DC | PRN
  Administered 2023-01-27 (×6): 40 mg via INTRAVENOUS
  Administered 2023-01-27: 70 mg via INTRAVENOUS
  Administered 2023-01-27 (×4): 40 mg via INTRAVENOUS

## 2023-01-27 MED ORDER — PROPOFOL 10 MG/ML IV BOLUS
INTRAVENOUS | Status: AC
  Filled 2023-01-27: qty 40

## 2023-01-27 MED ORDER — SODIUM CHLORIDE 0.9 % IV SOLN: INTRAVENOUS | Status: DC

## 2023-01-27 MED ORDER — LIDOCAINE HCL (PF) 2 % IJ SOLN
INTRAMUSCULAR | Status: DC | PRN
  Administered 2023-01-27: 40 mg via INTRADERMAL

## 2023-01-27 MED ORDER — PROPOFOL 10 MG/ML IV BOLUS
INTRAVENOUS | Status: AC
  Filled 2023-01-27: qty 20

## 2023-01-27 NOTE — Transfer of Care (Signed)
Immediate Anesthesia Transfer of Care Note  Patient: Justin Jackson  Procedure(s) Performed: COLONOSCOPY WITH PROPOFOL  Patient Location: PACU and Endoscopy Unit  Anesthesia Type:MAC  Level of Consciousness: awake and alert   Airway & Oxygen Therapy: Patient Spontanous Breathing and Patient connected to nasal cannula oxygen  Post-op Assessment: Report given to RN and Post -op Vital signs reviewed and stable  Post vital signs: Reviewed and stable  Last Vitals:  Vitals Value Taken Time  BP 126/82 01/27/23 1155  Temp 36.4 C 01/27/23 1155  Pulse 103 01/27/23 1156  Resp 26 01/27/23 1156  SpO2 96 % 01/27/23 1156  Vitals shown include unfiled device data.  Last Pain:  Vitals:   01/27/23 1155  TempSrc: Temporal  PainSc: Asleep         Complications: No notable events documented.

## 2023-01-27 NOTE — H&P (Signed)
Arlyss Repress, MD [address]  [address]  [place], [place] [address]  Main: [phone]  Fax: [phone] Pager: [phone]  Primary Care Physician:  Eden Emms, NP Primary Gastroenterologist:  Dr. Arlyss Repress  Pre-Procedure History & Physical: HPI:  Justin Jackson is a 57 y.o. male is here for an colonoscopy.   Past Medical History:  Diagnosis Date   DM (diabetes mellitus), type 2 (HCC)    Hyperlipidemia    Hypertension     History reviewed. No pertinent surgical history.  Prior to Admission medications   Medication Sig Start Date End Date Taking? Authorizing Provider  FARXIGA 10 MG TABS tablet Take 10 mg by mouth daily.    [provider]  gabapentin (NEURONTIN) 600 MG tablet Take 1 tablet (600 mg total) by mouth at bedtime. 01/11/23   Eden Emms, NP  glipiZIDE (GLUCOTROL) 5 MG tablet Take 1 tablet (5 mg total) by mouth 2 (two) times daily before a meal. 12/08/22   Eden Emms, NP  lisinopril (ZESTRIL) 5 MG tablet Take 1 tablet (5 mg total) by mouth daily. 01/11/23   Eden Emms, NP  metFORMIN (GLUCOPHAGE) 1000 MG tablet Take 1 tablet (1,000 mg total) by mouth 2 (two) times daily with a meal. 01/11/23   Eden Emms, NP  rosuvastatin (CRESTOR) 20 MG tablet Take 20 mg by mouth daily.    [provider]    Allergies as of 12/09/2022   (No Known Allergies)    Family History  Problem Relation Age of Onset   Diabetes Mother     Social History   Socioeconomic History   Marital status: Married    Spouse name: Lorene Dy   Number of children: 2   Years of education: Not on file   Highest education level: Not on file  Occupational History   Not on file  Tobacco Use   Smoking status: Never   Smokeless tobacco: Never  Vaping Use   Vaping status: Never Used  Substance and Sexual Activity   Alcohol use: No   Drug use: No   Sexual activity: Not on file  Other Topics Concern   Not on file  Social History Narrative   Fulltime:  self employeed       Koby (27)   Conjana (30)   Social Determinants of Health   Financial Resource Strain: Not on file  Food Insecurity: Not on file  Transportation Needs: Not on file  Physical Activity: Not on file  Stress: Not on file  Social Connections: Not on file  Intimate Partner Violence: Not on file    Review of Systems: See HPI, otherwise negative ROS  Physical Exam: BP (!) 154/82   Pulse 81   Temp (!) 96.7 F (35.9 C) (Temporal)   Resp 20   Ht 6\' 2"  (1.88 m)   Wt 128 kg   SpO2 97%   BMI 36.23 kg/m  General:   Alert,  pleasant and cooperative in NAD Head:  Normocephalic and atraumatic. Neck:  Supple; no masses or thyromegaly. Lungs:  Clear throughout to auscultation.    Heart:  Regular rate and rhythm. Abdomen:  Soft, nontender and nondistended. Normal bowel sounds, without guarding, and without rebound.   Neurologic:  Alert and  oriented x4;  grossly normal neurologically.  Impression/Plan: Justin Jackson is here for an colonoscopy to be performed for colon cancer screening  Risks, benefits, limitations, and alternatives regarding  colonoscopy have been reviewed with the patient.  Questions have been answered.  All parties agreeable.   Lannette Donath, MD  01/27/2023, 10:29 AM

## 2023-01-27 NOTE — Anesthesia Postprocedure Evaluation (Signed)
Anesthesia Post Note  Patient: Justin Jackson  Procedure(s) Performed: COLONOSCOPY WITH PROPOFOL  Patient location during evaluation: PACU Anesthesia Type: General Level of consciousness: awake Pain management: satisfactory to patient Vital Signs Assessment: post-procedure vital signs reviewed and stable Respiratory status: spontaneous breathing Cardiovascular status: stable Anesthetic complications: no   No notable events documented.   Last Vitals:  Vitals:   01/27/23 1155 01/27/23 1215  BP: 126/82 (!) 152/99  Pulse: (!) 104 84  Resp: (!) 26 (!) 26  Temp: (!) 36.4 C   SpO2: 96% 100%    Last Pain:  Vitals:   01/27/23 1155  TempSrc: Temporal  PainSc: Asleep                 VAN STAVEREN,Oryon Gary

## 2023-01-27 NOTE — Op Note (Signed)
[facility] Patient Name: Justin Jackson Procedure Date: 01/27/2023 11:12 AM MRN: [id number] Account #: [id number] Date of Birth: Dec 11, 1965 Admit Type: Outpatient Age: 57 Room: [facility] ENDO ROOM 2 Gender: Male Note Status: Finalized Instrument Name: Prentice Docker [id number] Procedure:             Colonoscopy Indications:           Screening for colorectal malignant neoplasm, This is                         the patient's first colonoscopy Providers:             Toney Reil MD, MD Medicines:             General Anesthesia Complications:         No immediate complications. Estimated blood loss: None. Procedure:             Pre-Anesthesia Assessment:                        - Prior to the procedure, a History and Physical was                         performed, and patient medications and allergies were                         reviewed. The patient is competent. The risks and                         benefits of the procedure and the sedation options and                         risks were discussed with the patient. All questions                         were answered and informed consent was obtained.                         Patient identification and proposed procedure were                         verified by the physician, the nurse, the                         anesthesiologist, the anesthetist and the technician                         in the pre-procedure area in the procedure room in the                         endoscopy suite. Mental Status Examination: alert and                         oriented. Airway Examination: normal oropharyngeal                         airway and neck mobility. Respiratory Examination:                         clear to auscultation.  CV Examination: normal.                         Prophylactic Antibiotics: The patient does not require                         prophylactic antibiotics. Prior Anticoagulants: The                          patient has taken no anticoagulant or antiplatelet                         agents. ASA Grade Assessment: III - A patient with                         severe systemic disease. After reviewing the risks and                         benefits, the patient was deemed in satisfactory                         condition to undergo the procedure. The anesthesia                         plan was to use general anesthesia. Immediately prior                         to administration of medications, the patient was                         re-assessed for adequacy to receive sedatives. The                         heart rate, respiratory rate, oxygen saturations,                         blood pressure, adequacy of pulmonary ventilation, and                         response to care were monitored throughout the                         procedure. The physical status of the patient was                         re-assessed after the procedure.                        After obtaining informed consent, the colonoscope was                         passed under direct vision. Throughout the procedure,                         the patient's blood pressure, pulse, and oxygen                         saturations were monitored continuously. The  Colonoscope was introduced through the anus and                         advanced to the the cecum, identified by appendiceal                         orifice and ileocecal valve. The colonoscopy was                         unusually difficult due to a redundant colon,                         significant looping and the patient's body habitus.                         Successful completion of the procedure was aided by                         changing the patient to a supine position, changing                         the patient to a prone position and applying abdominal                         pressure. The patient tolerated the procedure well.                          The quality of the bowel preparation was adequate to                         identify polyps greater than 5 mm in size. The                         ileocecal valve, appendiceal orifice, and rectum were                         photographed. Findings:      The perianal and digital rectal examinations were normal. Pertinent       negatives include normal sphincter tone and no palpable rectal lesions.      Four sessile polyps were found in the transverse colon and ascending       colon. The polyps were 5 to 6 mm in size. These polyps were removed with       a cold snare. Resection and retrieval were complete. Estimated blood       loss: none.      Non-bleeding external hemorrhoids were found during retroflexion. The       hemorrhoids were medium-sized.      The exam was otherwise without abnormality. Impression:            - Four 5 to 6 mm polyps in the transverse colon and in                         the ascending colon, removed with a cold snare.                         Resected and retrieved.                        -  Non-bleeding external hemorrhoids.                        - The examination was otherwise normal. Recommendation:        - Discharge patient to home (with escort).                        - Resume previous diet today.                        - Continue present medications.                        - Await pathology results.                        - Repeat colonoscopy in 3 years with 2 day prep for                         surveillance of multiple polyps. Procedure Code(s):     --- Professional ---                        [phone], Colonoscopy, flexible; with removal of                         tumor(s), polyp(s), or other lesion(s) by snare                         technique Diagnosis Code(s):     --- Professional ---                        Z12.11, Encounter for screening for malignant neoplasm                         of colon                        D12.3, Benign neoplasm  of transverse colon (hepatic                         flexure or splenic flexure)                        D12.2, Benign neoplasm of ascending colon                        K64.4, Residual hemorrhoidal skin tags CPT copyright 2022 American Medical Association. All rights reserved. The codes documented in this report are preliminary and upon coder review may  be revised to meet current compliance requirements. Dr. Libby Maw Toney Reil MD, MD 01/27/2023 11:55:39 AM This report has been signed electronically. Number of Addenda: 0 Note Initiated On: 01/27/2023 11:12 AM Scope Withdrawal Time: 0 hours 11 minutes 52 seconds  Total Procedure Duration: 0 hours 30 minutes 9 seconds  Estimated Blood Loss:  Estimated blood loss: none.      [facility]

## 2023-01-27 NOTE — Anesthesia Preprocedure Evaluation (Signed)
Anesthesia Evaluation  Patient identified by MRN, date of birth, ID band Patient awake    Reviewed: Allergy & Precautions, NPO status , Patient's Chart, lab work & pertinent test results  Airway Mallampati: III  TM Distance: >3 FB Neck ROM: Full    Dental  (+) Teeth Intact   Pulmonary neg pulmonary ROS   Pulmonary exam normal breath sounds clear to auscultation       Cardiovascular Exercise Tolerance: Good hypertension, Pt. on medications negative cardio ROS Normal cardiovascular exam Rhythm:Regular Rate:Normal     Neuro/Psych negative neurological ROS  negative psych ROS   GI/Hepatic negative GI ROS, Neg liver ROS,,,  Endo/Other  negative endocrine ROSdiabetes, Poorly Controlled, Type 2, Oral Hypoglycemic Agents  Morbid obesity  Renal/GU negative Renal ROS  negative genitourinary   Musculoskeletal   Abdominal  (+) + obese  Peds negative pediatric ROS (+)  Hematology negative hematology ROS (+)   Anesthesia Other Findings Past Medical History: No date: DM (diabetes mellitus), type 2 (HCC) No date: Hyperlipidemia No date: Hypertension  History reviewed. No pertinent surgical history.  BMI    Body Mass Index: 36.23 kg/m      Reproductive/Obstetrics negative OB ROS                             Anesthesia Physical Anesthesia Plan  ASA: 3  Anesthesia Plan: General   Post-op Pain Management:    Induction: Intravenous  PONV Risk Score and Plan: Propofol infusion and TIVA  Airway Management Planned: Natural Airway and Nasal Cannula  Additional Equipment:   Intra-op Plan:   Post-operative Plan:   Informed Consent: I have reviewed the patients History and Physical, chart, labs and discussed the procedure including the risks, benefits and alternatives for the proposed anesthesia with the patient or authorized representative who has indicated his/her understanding and  acceptance.     Dental Advisory Given  Plan Discussed with: CRNA and Surgeon  Anesthesia Plan Comments:        Anesthesia Quick Evaluation

## 2023-01-30 ENCOUNTER — Encounter: Payer: Self-pay | Admitting: Gastroenterology

## 2023-01-30 LAB — SURGICAL PATHOLOGY

## 2023-01-31 ENCOUNTER — Encounter: Payer: Self-pay | Admitting: Gastroenterology

## 2023-03-09 ENCOUNTER — Ambulatory Visit: Payer: Commercial Managed Care - PPO | Admitting: Nurse Practitioner

## 2023-03-17 ENCOUNTER — Ambulatory Visit: Payer: Commercial Managed Care - PPO | Admitting: Nurse Practitioner

## 2023-03-21 ENCOUNTER — Ambulatory Visit: Payer: Commercial Managed Care - PPO | Admitting: Nurse Practitioner

## 2023-03-21 VITALS — BP 132/88 | HR 110 | Temp 98.1°F | Ht 74.0 in | Wt 283.8 lb

## 2023-03-21 DIAGNOSIS — Z7985 Long-term (current) use of injectable non-insulin antidiabetic drugs: Secondary | ICD-10-CM

## 2023-03-21 DIAGNOSIS — Z7984 Long term (current) use of oral hypoglycemic drugs: Secondary | ICD-10-CM

## 2023-03-21 DIAGNOSIS — I152 Hypertension secondary to endocrine disorders: Secondary | ICD-10-CM

## 2023-03-21 DIAGNOSIS — E1159 Type 2 diabetes mellitus with other circulatory complications: Secondary | ICD-10-CM

## 2023-03-21 DIAGNOSIS — E1165 Type 2 diabetes mellitus with hyperglycemia: Secondary | ICD-10-CM

## 2023-03-21 DIAGNOSIS — G629 Polyneuropathy, unspecified: Secondary | ICD-10-CM

## 2023-03-21 DIAGNOSIS — E669 Obesity, unspecified: Secondary | ICD-10-CM | POA: Diagnosis not present

## 2023-03-21 DIAGNOSIS — E785 Hyperlipidemia, unspecified: Secondary | ICD-10-CM

## 2023-03-21 DIAGNOSIS — Z23 Encounter for immunization: Secondary | ICD-10-CM | POA: Diagnosis not present

## 2023-03-21 LAB — POCT GLYCOSYLATED HEMOGLOBIN (HGB A1C): Hemoglobin A1C: 11.1 % — AB (ref 4.0–5.6)

## 2023-03-21 MED ORDER — GABAPENTIN 600 MG PO TABS: 600.0000 mg | ORAL_TABLET | Freq: Every day | ORAL | 1 refills | Status: DC

## 2023-03-21 MED ORDER — GLIPIZIDE 5 MG PO TABS: 5.0000 mg | ORAL_TABLET | Freq: Two times a day (BID) | ORAL | 1 refills | Status: DC

## 2023-03-21 MED ORDER — FARXIGA 10 MG PO TABS: 10.0000 mg | ORAL_TABLET | Freq: Every day | ORAL | 1 refills | Status: DC

## 2023-03-21 MED ORDER — OZEMPIC (0.25 OR 0.5 MG/DOSE) 2 MG/3ML ~~LOC~~ SOPN: 0.2500 mg | PEN_INJECTOR | SUBCUTANEOUS | 0 refills | Status: DC

## 2023-03-21 MED ORDER — LISINOPRIL 5 MG PO TABS: 5.0000 mg | ORAL_TABLET | Freq: Every day | ORAL | 1 refills | Status: DC

## 2023-03-21 MED ORDER — ROSUVASTATIN CALCIUM 20 MG PO TABS: 20.0000 mg | ORAL_TABLET | Freq: Every day | ORAL | 1 refills | Status: DC

## 2023-03-21 NOTE — Progress Notes (Signed)
Established Patient Office Visit  Subjective   Patient ID: Justin Jackson, male    DOB: 08-03-1965  Age: 57 y.o. MRN: [id number]  Chief Complaint  Patient presents with   Diabetes   Medication Refill    Pt requests for refills for farxiga, gabapentin, metormin, lisinopril, crestor, glipizide    Immunizations    Shingles        DM2: Patient currently maintained on metformin 1000 mg twice daily, Farxiga 10 mg daily, glipizide 5 mg twice daily.  Patient is checking his glucose approximately twice a week States that he is getting 150-180.  States that his eating is erratic whilehe is on the road. States that takout and easy foods. He does drink water and a couple of sodas a day. He does like to drink sweet tea.  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   Review of Systems  Constitutional:  Negative for chills and fever.  Respiratory:  Negative for shortness of breath.   Cardiovascular:  Negative for chest pain.  Gastrointestinal:  Negative for abdominal pain, diarrhea, nausea and vomiting.  Neurological:  Negative for headaches.  Psychiatric/Behavioral:  Negative for hallucinations and suicidal ideas.       Objective:     BP 132/88   Pulse (!) 110   Temp 98.1 F (36.7 C) (Oral)   Ht 6\' 2"  (1.88 m)   Wt 283 lb 12.8 oz (128.7 kg)   SpO2 98%   BMI 36.44 kg/m  BP Readings from Last 3 Encounters:  03/21/23 132/88  01/27/23 (!) 152/99  12/08/22 124/80   Wt Readings from Last 3 Encounters:  03/21/23 283 lb 12.8 oz (128.7 kg)  01/27/23 282 lb 3.2 oz (128 kg)  12/08/22 278 lb 12.8 oz (126.5 kg)   SpO2 Readings from Last 3 Encounters:  03/21/23 98%  01/27/23 100%  12/08/22 98%      Physical Exam Vitals and nursing note reviewed.  Constitutional:      Appearance: Normal appearance.  Cardiovascular:     Rate and Rhythm: Normal rate and regular rhythm.     Heart sounds: Normal heart  sounds.  Pulmonary:     Effort: Pulmonary effort is normal.     Breath sounds: Normal breath sounds.  Abdominal:     General: Bowel sounds are normal.  Neurological:     Mental Status: He is alert.      Results for orders placed or performed in visit on 03/21/23  POCT glycosylated hemoglobin (Hb A1C)  Result Value Ref Range   Hemoglobin A1C 11.1 (A) 4.0 - 5.6 %   HbA1c POC (<> result, manual entry)     HbA1c, POC (prediabetic range)     HbA1c, POC (controlled diabetic range)        The ASCVD Risk score (Arnett DK, et al., 2019) failed to calculate for the following reasons:   The valid total cholesterol range is 130 to 320 mg/dL    Assessment & Plan:   Problem List Items Addressed This Visit       Cardiovascular and Mediastinum   Hypertension associated with diabetes (HCC)   Relevant Medications   FARXIGA 10 MG TABS tablet   rosuvastatin (CRESTOR) 20 MG tablet   glipiZIDE (GLUCOTROL) 5 MG tablet   lisinopril (ZESTRIL) 5 MG tablet   Semaglutide,0.25 or 0.5MG /DOS, (OZEMPIC, 0.25 OR 0.5 MG/DOSE,) 2 MG/3ML SOPN  Endocrine   Uncontrolled type 2 diabetes mellitus with hyperglycemia (HCC) - Primary   Patient currently maintained on metformin 1000 mg twice daily, Farxiga 10 mg daily, glipizide 5 mg twice daily.  Patient's A1c went up.  Will add Ozempic 0.25 mg once weekly for 4 weeks.  Titrate up to 0.5 mg Ozempic weekly thereafter patient will stay at 0.5 mg weekly until followed up in office with me.  Encourage patient to check glucose more often than what is doing.  Patient to continue working healthy lifestyle modifications and eating habits.      Relevant Medications   FARXIGA 10 MG TABS tablet   rosuvastatin (CRESTOR) 20 MG tablet   glipiZIDE (GLUCOTROL) 5 MG tablet   lisinopril (ZESTRIL) 5 MG tablet   Semaglutide,0.25 or 0.5MG /DOS, (OZEMPIC, 0.25 OR 0.5 MG/DOSE,) 2 MG/3ML SOPN   Other Relevant Orders   POCT glycosylated hemoglobin (Hb A1C) (Completed)      Other   Hyperlipidemia LDL goal <70   Relevant Medications   rosuvastatin (CRESTOR) 20 MG tablet   lisinopril (ZESTRIL) 5 MG tablet   Obesity (BMI 30-39.9)   Relevant Medications   FARXIGA 10 MG TABS tablet   glipiZIDE (GLUCOTROL) 5 MG tablet   Semaglutide,0.25 or 0.5MG /DOS, (OZEMPIC, 0.25 OR 0.5 MG/DOSE,) 2 MG/3ML SOPN   Other Visit Diagnoses       Need for shingles vaccine       Relevant Orders   Zoster Recombinant (Shingrix ) (Completed)     Neuropathy       Relevant Medications   gabapentin (NEURONTIN) 600 MG tablet       Return in about 3 months (around 06/19/2023) for DM recheck, Second shingles vaccine .    Audria Nine, NP

## 2023-03-21 NOTE — Assessment & Plan Note (Signed)
Patient currently maintained on metformin 1000 mg twice daily, Farxiga 10 mg daily, glipizide 5 mg twice daily.  Patient's A1c went up.  Will add Ozempic 0.25 mg once weekly for 4 weeks.  Titrate up to 0.5 mg Ozempic weekly thereafter patient will stay at 0.5 mg weekly until followed up in office with me.  Encourage patient to check glucose more often than what is doing.  Patient to continue working healthy lifestyle modifications and eating habits.

## 2023-03-21 NOTE — Patient Instructions (Signed)
Nice to see you today Your A1C is 11.1% today. We are going to add on once a week ozempic. Follow up with me in 3 months for a diabetes recheck and we can give you the second and final shingles vaccine.  I recommend that you check your sugar daily

## 2023-03-24 ENCOUNTER — Ambulatory Visit: Payer: Commercial Managed Care - PPO | Admitting: Nurse Practitioner

## 2023-04-10 ENCOUNTER — Other Ambulatory Visit (HOSPITAL_COMMUNITY): Payer: Self-pay

## 2023-04-17 ENCOUNTER — Other Ambulatory Visit (HOSPITAL_COMMUNITY): Payer: Self-pay

## 2023-05-03 ENCOUNTER — Telehealth: Payer: Self-pay

## 2023-05-03 ENCOUNTER — Other Ambulatory Visit (HOSPITAL_COMMUNITY): Payer: Self-pay

## 2023-05-03 NOTE — Telephone Encounter (Signed)
Pharmacy Patient Advocate Encounter   Received notification from CoverMyMeds that prior authorization for Farxiga 10mg  is required/requested.   Insurance verification completed.   The patient is insured through  [facility]  .   Per test claim: PA required; PA submitted to above mentioned insurance via Prompt PA Key/confirmation #/EOC [id number] Status is pending  See below for further information

## 2023-05-04 ENCOUNTER — Other Ambulatory Visit (HOSPITAL_COMMUNITY): Payer: Self-pay

## 2023-05-04 NOTE — Telephone Encounter (Signed)
Pharmacy Patient Advocate Encounter  Received notification from  [facility]  that Prior Authorization for Farxiga 10mg  has been APPROVED from 05/03/2023 to 05/01/2024. Ran test claim, Copay is $375.34 for a 90 day supply. This test claim was processed through [facility]- copay amounts may vary at other pharmacies due to pharmacy/plan contracts, or as the patient moves through the different stages of their insurance plan.   PA #/Case ID/Reference #: [id number]

## 2023-05-12 ENCOUNTER — Other Ambulatory Visit: Payer: Self-pay | Admitting: Nurse Practitioner

## 2023-05-12 DIAGNOSIS — E1165 Type 2 diabetes mellitus with hyperglycemia: Secondary | ICD-10-CM

## 2023-05-25 ENCOUNTER — Other Ambulatory Visit: Payer: Self-pay | Admitting: Nurse Practitioner

## 2023-05-25 DIAGNOSIS — G629 Polyneuropathy, unspecified: Secondary | ICD-10-CM

## 2023-05-29 NOTE — Telephone Encounter (Signed)
 Can we get the patient scheduled for a DM2 follow up on 06-19-2022 or a little after please

## 2023-05-29 NOTE — Telephone Encounter (Signed)
 Patient has been scheduled

## 2023-06-30 ENCOUNTER — Ambulatory Visit: Payer: Commercial Managed Care - PPO | Admitting: Nurse Practitioner

## 2023-06-30 VITALS — BP 150/88 | HR 108 | Temp 98.1°F | Ht 74.0 in | Wt 277.8 lb

## 2023-06-30 DIAGNOSIS — M62838 Other muscle spasm: Secondary | ICD-10-CM

## 2023-06-30 DIAGNOSIS — Z23 Encounter for immunization: Secondary | ICD-10-CM | POA: Diagnosis not present

## 2023-06-30 DIAGNOSIS — E1159 Type 2 diabetes mellitus with other circulatory complications: Secondary | ICD-10-CM | POA: Diagnosis not present

## 2023-06-30 DIAGNOSIS — Z7985 Long-term (current) use of injectable non-insulin antidiabetic drugs: Secondary | ICD-10-CM

## 2023-06-30 DIAGNOSIS — E1165 Type 2 diabetes mellitus with hyperglycemia: Secondary | ICD-10-CM | POA: Diagnosis not present

## 2023-06-30 DIAGNOSIS — I152 Hypertension secondary to endocrine disorders: Secondary | ICD-10-CM | POA: Diagnosis not present

## 2023-06-30 LAB — POCT GLYCOSYLATED HEMOGLOBIN (HGB A1C): Hemoglobin A1C: 7.8 % — AB (ref 4.0–5.6)

## 2023-06-30 MED ORDER — CYCLOBENZAPRINE HCL 5 MG PO TABS: 5.0000 mg | ORAL_TABLET | Freq: Every evening | ORAL | 1 refills | Status: DC | PRN

## 2023-06-30 MED ORDER — SEMAGLUTIDE (1 MG/DOSE) 4 MG/3ML ~~LOC~~ SOPN: 1.0000 mg | PEN_INJECTOR | SUBCUTANEOUS | 1 refills | Status: DC

## 2023-06-30 MED ORDER — CYCLOBENZAPRINE HCL 5 MG PO TABS: 5.0000 mg | ORAL_TABLET | Freq: Every evening | ORAL | 1 refills | Status: AC | PRN

## 2023-06-30 NOTE — Assessment & Plan Note (Signed)
 Currently on metformin 1000mg  BID, Glipizide 5mg  BID, farxiga 10mg , and ozempic 0.5mg . he had a good decrease in his A1C from 11.1 to 7.8%. we will increase ozempic to 1mg  weekly. Follow up in 3 months. Continue other diabetic medications as prescribed

## 2023-06-30 NOTE — Progress Notes (Signed)
 Established Patient Office Visit  Subjective   Patient ID: Justin Jackson, male    DOB: 03/04/1966  Age: 58 y.o. MRN: [id number]  Chief Complaint  Patient presents with   Diabetes   Medication Refill    Pt request refills on all medication for 6months supply.  Pt complains of need of muscle relaxer for sleep. States that his feet and back tighten up at night   Immunizations    2nd Shingles vaccine      DM2: patient currently on metformin 1000mg  BID, farxgia 10mg , glipizde 5mg  bid and was started on ozempic and titrated to 0.5mg . state that he is checking his sugar 3 times a week. He has been 87 and 94 when he has checked his sugars  States that he has cut out the sweet tea.  States that he is having foot crampins/spasms. Statse that it will move to the back and it will be tight like. States that he has trouble sleeping when it happens. Denies weakness, numbness/tingling.     Review of Systems  Constitutional:  Negative for chills and fever.  Respiratory:  Negative for shortness of breath.   Cardiovascular:  Negative for chest pain.  Gastrointestinal:  Negative for abdominal pain, constipation, diarrhea, nausea and vomiting.       BM daily   Neurological:  Negative for headaches.  Psychiatric/Behavioral:  Negative for hallucinations and suicidal ideas.       Objective:     BP (!) 150/88   Pulse (!) 108   Temp 98.1 F (36.7 C) (Oral)   Ht 6\' 2"  (1.88 m)   Wt 277 lb 12.8 oz (126 kg)   SpO2 97%   BMI 35.67 kg/m  BP Readings from Last 3 Encounters:  06/30/23 (!) 150/88  03/21/23 132/88  01/27/23 (!) 152/99   Wt Readings from Last 3 Encounters:  06/30/23 277 lb 12.8 oz (126 kg)  03/21/23 283 lb 12.8 oz (128.7 kg)  01/27/23 282 lb 3.2 oz (128 kg)   SpO2 Readings from Last 3 Encounters:  06/30/23 97%  03/21/23 98%  01/27/23 100%      Physical Exam Vitals and nursing note reviewed.  Constitutional:      Appearance: Normal appearance.  Cardiovascular:      Rate and Rhythm: Normal rate and regular rhythm.     Heart sounds: Normal heart sounds.  Pulmonary:     Effort: Pulmonary effort is normal.     Breath sounds: Normal breath sounds.  Abdominal:     General: Bowel sounds are normal.  Musculoskeletal:        General: No tenderness.     Cervical back: Deformity present.     Lumbar back: No tenderness or bony tenderness. Negative right straight leg raise test and negative left straight leg raise test.  Neurological:     Mental Status: He is alert.     Comments: Bilateral lower extremity strength 5/5      Results for orders placed or performed in visit on 06/30/23  POCT glycosylated hemoglobin (Hb A1C)  Result Value Ref Range   Hemoglobin A1C 7.8 (A) 4.0 - 5.6 %   HbA1c POC (<> result, manual entry)     HbA1c, POC (prediabetic range)     HbA1c, POC (controlled diabetic range)        The ASCVD Risk score (Arnett DK, et al., 2019) failed to calculate for the following reasons:   The valid total cholesterol range is 130 to 320 mg/dL  Assessment & Plan:   Problem List Items Addressed This Visit       Cardiovascular and Mediastinum   Hypertension associated with diabetes (HCC)   Previous blood pressure was controlled. Not controlled today. Will continue lisinopril 5mg  daily. If continued elevated BP consider increasing dose or adding BB for heart rate       Relevant Medications   Semaglutide, 1 MG/DOSE, 4 MG/3ML SOPN     Endocrine   Uncontrolled type 2 diabetes mellitus with hyperglycemia (HCC) - Primary   Currently on metformin 1000mg  BID, Glipizide 5mg  BID, farxiga 10mg , and ozempic 0.5mg . he had a good decrease in his A1C from 11.1 to 7.8%. we will increase ozempic to 1mg  weekly. Follow up in 3 months. Continue other diabetic medications as prescribed       Relevant Medications   Semaglutide, 1 MG/DOSE, 4 MG/3ML SOPN   Other Relevant Orders   POCT glycosylated hemoglobin (Hb A1C) (Completed)     Other   Muscle spasm    Flexeril 5mg  at bedtime PRN. Sedation cautions reviewed       Relevant Medications   cyclobenzaprine (FLEXERIL) 5 MG tablet   Other Visit Diagnoses       Need for shingles vaccine       Relevant Orders   Zoster Recombinant (Shingrix ) (Completed)       Return in about 3 months (around 09/30/2023) for DM recheck.    Audria Nine, NP

## 2023-06-30 NOTE — Assessment & Plan Note (Signed)
 Previous blood pressure was controlled. Not controlled today. Will continue lisinopril 5mg  daily. If continued elevated BP consider increasing dose or adding BB for heart rate

## 2023-06-30 NOTE — Patient Instructions (Addendum)
 Nice to see you today I have increased the ozempic to 1mg  once weekly  We gave you the final of the shingles series  Follow up with me in 3 months.   The muscle relaxer can make you sleepy so use caution Also make sure you are getting 64oz of water a day

## 2023-06-30 NOTE — Assessment & Plan Note (Signed)
 Flexeril 5mg  at bedtime PRN. Sedation cautions reviewed

## 2023-06-30 NOTE — Progress Notes (Signed)
 Resent prescriptions to correct pharmacy per Audria Nine, NP.  Called [id number] - [place] - [address] AT [facility] OF [address] to cancel the original prescriptions sent.

## 2023-09-15 ENCOUNTER — Ambulatory Visit: Admitting: Nurse Practitioner

## 2023-09-16 ENCOUNTER — Other Ambulatory Visit: Payer: Self-pay | Admitting: Nurse Practitioner

## 2023-09-16 DIAGNOSIS — E1165 Type 2 diabetes mellitus with hyperglycemia: Secondary | ICD-10-CM

## 2023-09-16 DIAGNOSIS — I152 Hypertension secondary to endocrine disorders: Secondary | ICD-10-CM

## 2023-09-18 NOTE — Telephone Encounter (Signed)
 Patient needs Dm2 follow up in 30 days to continue getting refills

## 2023-09-19 NOTE — Telephone Encounter (Signed)
Lvmtcb, no mychart set up to send message  

## 2023-09-20 NOTE — Telephone Encounter (Signed)
 Lvmtcb

## 2023-09-21 ENCOUNTER — Encounter: Payer: Self-pay | Admitting: Nurse Practitioner

## 2023-09-21 NOTE — Telephone Encounter (Signed)
 Letter sent.

## 2023-09-21 NOTE — Telephone Encounter (Signed)
 Lvmtcb 3rd attempt.

## 2023-10-03 ENCOUNTER — Other Ambulatory Visit: Payer: Self-pay | Admitting: Nurse Practitioner

## 2023-10-03 DIAGNOSIS — E1165 Type 2 diabetes mellitus with hyperglycemia: Secondary | ICD-10-CM

## 2023-11-02 ENCOUNTER — Telehealth: Payer: Self-pay | Admitting: Nurse Practitioner

## 2023-11-02 DIAGNOSIS — E1165 Type 2 diabetes mellitus with hyperglycemia: Secondary | ICD-10-CM

## 2023-11-02 NOTE — Telephone Encounter (Signed)
 Copied from CRM #[id number]. Topic: Clinical - Medication Refill >> Nov 02, 2023 12:49 PM Thersia C wrote: Medication: pioglitazone  (ACTOS ) 45 MG tablet  Has the patient contacted their pharmacy? Yes (Agent: If no, request that the patient contact the pharmacy for the refill. If patient does not wish to contact the pharmacy document the reason why and proceed with request.) (Agent: If yes, when and what did the pharmacy advise?)  This is the patient's preferred pharmacy:   [id number] [place], [place] - [address] AT [facility] OF [address] [place] [address] Phone: [phone] Fax: [phone]  Is this the correct pharmacy for this prescription? Yes If no, delete pharmacy and type the correct one.   Has the prescription been filled recently? No  Is the patient out of the medication? Yes  Has the patient been seen for an appointment in the last year OR does the patient have an upcoming appointment? Yes  Can we respond through MyChart? Yes  Agent: Please be advised that Rx refills may take up to 3 business days. We ask that you follow-up with your pharmacy.

## 2023-11-02 NOTE — Telephone Encounter (Signed)
 Last office note he was not on piloglitazone and I have not prescribed it. Can we find out who has and when it was prescribed?

## 2023-11-03 NOTE — Telephone Encounter (Signed)
 Called pt.  Pt appeared very upset. States that he is taking piloglitazone and states that he is all out of pills and needs a refill. Pt complains that PCP is the one who prescribes this medication and has been for a long time.  Please advise.

## 2023-11-06 ENCOUNTER — Other Ambulatory Visit: Payer: Self-pay | Admitting: Nurse Practitioner

## 2023-11-06 DIAGNOSIS — E1165 Type 2 diabetes mellitus with hyperglycemia: Secondary | ICD-10-CM

## 2023-11-06 MED ORDER — PIOGLITAZONE HCL 45 MG PO TABS: 45.0000 mg | ORAL_TABLET | Freq: Every day | ORAL | 0 refills | Status: DC

## 2023-11-06 NOTE — Telephone Encounter (Signed)
 Contacted the pharmacy and the provider that had filled that medication was Donnice Russell. 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 He is over due for a follow up visit with me. I wanted to see him in 3 months which would have been the end of June.   I will supply him with a 30 day supply until I see him in office

## 2023-11-07 NOTE — Telephone Encounter (Signed)
 Pt I scheduled with overdue follow up. Informed pt on prescription that was filled yesterday. ACTOS  45 mg.

## 2023-12-22 ENCOUNTER — Encounter: Payer: Self-pay | Admitting: Nurse Practitioner

## 2023-12-22 ENCOUNTER — Ambulatory Visit: Admitting: Nurse Practitioner

## 2023-12-22 VITALS — BP 122/68 | HR 106 | Temp 98.1°F | Ht 74.0 in | Wt 272.4 lb

## 2023-12-22 DIAGNOSIS — E785 Hyperlipidemia, unspecified: Secondary | ICD-10-CM

## 2023-12-22 DIAGNOSIS — I152 Hypertension secondary to endocrine disorders: Secondary | ICD-10-CM

## 2023-12-22 DIAGNOSIS — G629 Polyneuropathy, unspecified: Secondary | ICD-10-CM | POA: Diagnosis not present

## 2023-12-22 DIAGNOSIS — E669 Obesity, unspecified: Secondary | ICD-10-CM

## 2023-12-22 DIAGNOSIS — Z125 Encounter for screening for malignant neoplasm of prostate: Secondary | ICD-10-CM

## 2023-12-22 DIAGNOSIS — Z0000 Encounter for general adult medical examination without abnormal findings: Secondary | ICD-10-CM | POA: Diagnosis not present

## 2023-12-22 DIAGNOSIS — E1159 Type 2 diabetes mellitus with other circulatory complications: Secondary | ICD-10-CM

## 2023-12-22 DIAGNOSIS — Z7984 Long term (current) use of oral hypoglycemic drugs: Secondary | ICD-10-CM

## 2023-12-22 DIAGNOSIS — E1165 Type 2 diabetes mellitus with hyperglycemia: Secondary | ICD-10-CM | POA: Diagnosis not present

## 2023-12-22 DIAGNOSIS — Z7985 Long-term (current) use of injectable non-insulin antidiabetic drugs: Secondary | ICD-10-CM

## 2023-12-22 LAB — POCT GLYCOSYLATED HEMOGLOBIN (HGB A1C): Hemoglobin A1C: 7.8 % — AB (ref 4.0–5.6)

## 2023-12-22 MED ORDER — GABAPENTIN 600 MG PO TABS: 600.0000 mg | ORAL_TABLET | Freq: Every day | ORAL | 1 refills | Status: DC

## 2023-12-22 MED ORDER — LISINOPRIL 5 MG PO TABS: 5.0000 mg | ORAL_TABLET | Freq: Every day | ORAL | 1 refills | Status: AC

## 2023-12-22 MED ORDER — METFORMIN HCL 1000 MG PO TABS: 1000.0000 mg | ORAL_TABLET | Freq: Two times a day (BID) | ORAL | 1 refills | Status: AC

## 2023-12-22 MED ORDER — PIOGLITAZONE HCL 45 MG PO TABS: 45.0000 mg | ORAL_TABLET | Freq: Every day | ORAL | 1 refills | Status: DC

## 2023-12-22 MED ORDER — SEMAGLUTIDE (2 MG/DOSE) 8 MG/3ML ~~LOC~~ SOPN: 2.0000 mg | PEN_INJECTOR | SUBCUTANEOUS | 1 refills | Status: AC

## 2023-12-22 MED ORDER — ROSUVASTATIN CALCIUM 20 MG PO TABS: 20.0000 mg | ORAL_TABLET | Freq: Every day | ORAL | 1 refills | Status: AC

## 2023-12-22 MED ORDER — FARXIGA 10 MG PO TABS: 10.0000 mg | ORAL_TABLET | Freq: Every day | ORAL | 1 refills | Status: AC

## 2023-12-22 NOTE — Assessment & Plan Note (Signed)
 Discussed age-appropriate immunizations and screening exams.  Did review patient's personal, surgical, social, family histories.  Patient is up-to-date on all age-appropriate vaccinations he would like.  Update flu vaccine today.  Patient is up-to-date on CRC screening.  PSA for prostate cancer screening today.  Patient was given information at discharge about preventative healthcare maintenance with anticipatory guidance.

## 2023-12-22 NOTE — Assessment & Plan Note (Signed)
 Patient currently maintained on Ozempic  1 mg weekly, Farxiga  10 mg daily, glipizide  5 mg twice daily, metformin  1000 mg twice daily, pioglitazone  45 mg daily.  A1c not well-controlled at 7.8% will increase Ozempic  to 2 mg weekly.  Continue work on healthy lifestyle modifications

## 2023-12-22 NOTE — Patient Instructions (Signed)
 Nice to see you today  I will be in touch with the labs once I have them  Follow up with me in 3 months, sooner if you need me  We updated your flu vaccine I increased your ozempic  to 2mg  once a week

## 2023-12-22 NOTE — Assessment & Plan Note (Signed)
 Patient currently maintained on rosuvastatin  20 mg daily.  Pending lipid panel today

## 2023-12-22 NOTE — Progress Notes (Signed)
 Established Patient Office Visit  Subjective   Patient ID: Justin Jackson, male    DOB: Oct 26, 1965  Age: 58 y.o. MRN: [id number]  Chief Complaint  Patient presents with   Follow-up   Medication Refill    HPI  DM2: Patient currently maintained on Farxiga  10 mg daily, glipizide  5 mg twice daily, metformin  1000 mg twice daily, pioglitazone  45 mg daily, Ozempic  1 mg weekly.  Currently maintained on lisinopril  5 mg daily for kidney protection in setting of diabetes. He is checking them once a day. He is getting 165-195.  Neuropathy: Currently maintained on gabapentin  600 mg nightly  HLD: Currently maintained on rosuvastatin  20 mg daily  for complete physical and follow up of chronic conditions.  Immunizations: -Tetanus: Completed in 2022 -Influenza: up date today  -Shingles: Completed Shingrix  series -Pneumonia: states within 5 years  Diet: Fair diet. He is eating 2 meals a day. Sometimes once a year. He is drinking water, he has cut sodas in half ( getting smaller cans), tea.  Exercise: No regular exercise. He is doing weights and walking. States that he does a lot with his work   Eye exam: needs updating on the scheduled Dental exam: Completes semi-annually    Colonoscopy: Completed in 01/27/2023, repeat in 3 years.  Patient will be due 2027 Lung Cancer Screening: NA  PSA: Due  Sleep: states that it depends on his schedule. He will sleep 6-10 hours. Sometimes he feels rested. He does not snore        Review of Systems  Constitutional:  Negative for chills and fever.  Respiratory:  Negative for shortness of breath.   Cardiovascular:  Negative for chest pain and leg swelling.  Gastrointestinal:  Negative for abdominal pain, blood in stool, constipation, diarrhea, nausea and vomiting.       BM daily   Genitourinary:  Negative for dysuria and hematuria.  Neurological:  Positive for tingling. Negative for headaches.  Psychiatric/Behavioral:  Negative for hallucinations  and suicidal ideas.       Objective:     BP 122/68   Pulse (!) 106   Temp 98.1 F (36.7 C) (Oral)   Ht 6' 2 (1.88 m)   Wt 272 lb 6.4 oz (123.6 kg)   SpO2 94%   BMI 34.97 kg/m  BP Readings from Last 3 Encounters:  12/22/23 122/68  06/30/23 (!) 150/88  03/21/23 132/88   Wt Readings from Last 3 Encounters:  12/22/23 272 lb 6.4 oz (123.6 kg)  06/30/23 277 lb 12.8 oz (126 kg)  03/21/23 283 lb 12.8 oz (128.7 kg)   SpO2 Readings from Last 3 Encounters:  12/22/23 94%  06/30/23 97%  03/21/23 98%      Physical Exam Vitals and nursing note reviewed.  Constitutional:      Appearance: Normal appearance.  HENT:     Right Ear: Tympanic membrane, ear canal and external ear normal.     Left Ear: Tympanic membrane, ear canal and external ear normal.     Mouth/Throat:     Mouth: Mucous membranes are moist.     Pharynx: Oropharynx is clear.  Eyes:     Extraocular Movements: Extraocular movements intact.     Pupils: Pupils are equal, round, and reactive to light.  Cardiovascular:     Rate and Rhythm: Normal rate and regular rhythm.     Pulses: Normal pulses.     Heart sounds: Normal heart sounds.  Pulmonary:     Effort: Pulmonary effort is normal.  Breath sounds: Normal breath sounds.  Abdominal:     General: Bowel sounds are normal. There is no distension.     Palpations: There is no mass.     Tenderness: There is no abdominal tenderness.     Hernia: No hernia is present.  Genitourinary:    Comments: deferred Musculoskeletal:     Right lower leg: No edema.     Left lower leg: No edema.  Lymphadenopathy:     Cervical: No cervical adenopathy.  Skin:    General: Skin is warm.  Neurological:     General: No focal deficit present.     Mental Status: He is alert.     Deep Tendon Reflexes:     Reflex Scores:      Bicep reflexes are 2+ on the right side and 2+ on the left side.      Patellar reflexes are 2+ on the right side and 2+ on the left side.    Comments:  Bilateral upper and lower extremity strength 5/5  Psychiatric:        Mood and Affect: Mood normal.        Behavior: Behavior normal.        Thought Content: Thought content normal.        Judgment: Judgment normal.     Title   Diabetic Foot Exam - detailed Is there a history of foot ulcer?: No Is there a foot ulcer now?: No Is there swelling?: No Is there elevated skin temperature?: No Is there abnormal foot shape?: No Is there a claw toe deformity?: No Are the toenails long?: No Are the toenails thick?: No Are the toenails ingrown?: No Pulse Foot Exam completed.: Yes   Right Posterior Tibialis: Present Left posterior Tibialis: Present   Right Dorsalis Pedis: Present Left Dorsalis Pedis: Present     Sensory Foot Exam Completed.: Yes Semmes-Weinstein Monofilament Test + means has sensation and - means no sensation      Image components are not supported.   Image components are not supported. Image components are not supported.  Tuning Fork Comments All 10 sites sensation intact bilaterally     Results for orders placed or performed in visit on 12/22/23  POCT glycosylated hemoglobin (Hb A1C)  Result Value Ref Range   Hemoglobin A1C 7.8 (A) 4.0 - 5.6 %   HbA1c POC (<> result, manual entry)     HbA1c, POC (prediabetic range)     HbA1c, POC (controlled diabetic range)        The ASCVD Risk score (Arnett DK, et al., 2019) failed to calculate for the following reasons:   The valid total cholesterol range is 130 to 320 mg/dL    Assessment & Plan:   Problem List Items Addressed This Visit       Cardiovascular and Mediastinum   Hypertension associated with diabetes (HCC)   Patient currently maintained on lisinopril  5 mg daily blood pressure is well-controlled continue medication as prescribed      Relevant Medications   glyBURIDE  (DIABETA ) 5 MG tablet   pioglitazone  (ACTOS ) 45 MG tablet   rosuvastatin  (CRESTOR ) 20 MG tablet   FARXIGA  10 MG TABS  tablet   metFORMIN  (GLUCOPHAGE ) 1000 MG tablet   lisinopril  (ZESTRIL ) 5 MG tablet   Semaglutide , 2 MG/DOSE, 8 MG/3ML SOPN   Other Relevant Orders   Lipid panel   TSH     Endocrine   Uncontrolled type 2 diabetes mellitus with hyperglycemia (HCC)   Patient currently maintained on Ozempic   1 mg weekly, Farxiga  10 mg daily, glipizide  5 mg twice daily, metformin  1000 mg twice daily, pioglitazone  45 mg daily.  A1c not well-controlled at 7.8% will increase Ozempic  to 2 mg weekly.  Continue work on healthy lifestyle modifications      Relevant Medications   glyBURIDE  (DIABETA ) 5 MG tablet   pioglitazone  (ACTOS ) 45 MG tablet   rosuvastatin  (CRESTOR ) 20 MG tablet   FARXIGA  10 MG TABS tablet   metFORMIN  (GLUCOPHAGE ) 1000 MG tablet   lisinopril  (ZESTRIL ) 5 MG tablet   Semaglutide , 2 MG/DOSE, 8 MG/3ML SOPN   Other Relevant Orders   POCT glycosylated hemoglobin (Hb A1C) (Completed)   Lipid panel   Microalbumin / creatinine urine ratio     Nervous and Auditory   Neuropathy   Currently maintained on gabapentin  600 mg nightly.  Refill provided today      Relevant Medications   gabapentin  (NEURONTIN ) 600 MG tablet     Other   Hyperlipidemia LDL goal <70   Patient currently maintained on rosuvastatin  20 mg daily.  Pending lipid panel today      Relevant Medications   rosuvastatin  (CRESTOR ) 20 MG tablet   lisinopril  (ZESTRIL ) 5 MG tablet   Other Relevant Orders   Lipid panel   Obesity (BMI 30-39.9)   Pending TSH and lipid panel.  Continue work on healthy lifestyle modifications      Relevant Medications   glyBURIDE  (DIABETA ) 5 MG tablet   pioglitazone  (ACTOS ) 45 MG tablet   FARXIGA  10 MG TABS tablet   metFORMIN  (GLUCOPHAGE ) 1000 MG tablet   Semaglutide , 2 MG/DOSE, 8 MG/3ML SOPN   Other Relevant Orders   Lipid panel   TSH   Preventative health care - Primary   Discussed age-appropriate immunizations and screening exams.  Did review patient's personal, surgical, social, family  histories.  Patient is up-to-date on all age-appropriate vaccinations he would like.  Update flu vaccine today.  Patient is up-to-date on CRC screening.  PSA for prostate cancer screening today.  Patient was given information at discharge about preventative healthcare maintenance with anticipatory guidance.      Relevant Orders   CBC with Differential/Platelet   Comprehensive metabolic panel with GFR   TSH   Other Visit Diagnoses       Screening for prostate cancer       Relevant Orders   PSA       Return in about 3 months (around 03/22/2024) for DM recheck.    Adina Crandall, NP

## 2023-12-22 NOTE — Assessment & Plan Note (Signed)
 Currently maintained on gabapentin  600 mg nightly.  Refill provided today

## 2023-12-22 NOTE — Assessment & Plan Note (Signed)
 Patient currently maintained on lisinopril  5 mg daily blood pressure is well-controlled continue medication as prescribed

## 2023-12-22 NOTE — Assessment & Plan Note (Signed)
 Pending TSH and lipid panel.  Continue work on healthy lifestyle modifications

## 2023-12-23 LAB — CBC WITH DIFFERENTIAL/PLATELET
Absolute Lymphocytes: 2907 {cells}/uL (ref 850–3900)
Absolute Monocytes: 672 {cells}/uL (ref 200–950)
Basophils Absolute: 46 {cells}/uL (ref 0–200)
Basophils Relative: 0.5 %
Eosinophils Absolute: 239 {cells}/uL (ref 15–500)
Eosinophils Relative: 2.6 %
HCT: 45.3 % (ref 38.5–50.0)
Hemoglobin: 14.6 g/dL (ref 13.2–17.1)
MCH: 28.1 pg (ref 27.0–33.0)
MCHC: 32.2 g/dL (ref 32.0–36.0)
MCV: 87.3 fL (ref 80.0–100.0)
MPV: 11.6 fL (ref 7.5–12.5)
Monocytes Relative: 7.3 %
Neutro Abs: 5336 {cells}/uL (ref 1500–7800)
Neutrophils Relative %: 58 %
Platelets: 172 Thousand/uL (ref 140–400)
RBC: 5.19 Million/uL (ref 4.20–5.80)
RDW: 13 % (ref 11.0–15.0)
Total Lymphocyte: 31.6 %
WBC: 9.2 Thousand/uL (ref 3.8–10.8)

## 2023-12-23 LAB — COMPREHENSIVE METABOLIC PANEL WITH GFR
AG Ratio: 1.8 (calc) (ref 1.0–2.5)
ALT: 21 U/L (ref 9–46)
AST: 14 U/L (ref 10–35)
Albumin: 4.4 g/dL (ref 3.6–5.1)
Alkaline phosphatase (APISO): 55 U/L (ref 35–144)
BUN: 10 mg/dL (ref 7–25)
CO2: 25 mmol/L (ref 20–32)
Calcium: 9.3 mg/dL (ref 8.6–10.3)
Chloride: 104 mmol/L (ref 98–110)
Creat: 0.92 mg/dL (ref 0.70–1.30)
Globulin: 2.5 g/dL (ref 1.9–3.7)
Glucose, Bld: 166 mg/dL — ABNORMAL HIGH (ref 65–99)
Potassium: 3.9 mmol/L (ref 3.5–5.3)
Sodium: 141 mmol/L (ref 135–146)
Total Bilirubin: 0.5 mg/dL (ref 0.2–1.2)
Total Protein: 6.9 g/dL (ref 6.1–8.1)
eGFR: 98 mL/min/1.73m2 (ref >=60)

## 2023-12-23 LAB — MICROALBUMIN / CREATININE URINE RATIO
Creatinine, Urine: 276 mg/dL (ref 20–320)
Microalb Creat Ratio: 12 mg/g{creat} (ref <30)
Microalb, Ur: 3.3 mg/dL

## 2023-12-23 LAB — LIPID PANEL
Cholesterol: 99 mg/dL (ref <200)
HDL: 51 mg/dL (ref >=40)
LDL Cholesterol (Calc): 33 mg/dL
Non-HDL Cholesterol (Calc): 48 mg/dL (ref <130)
Total CHOL/HDL Ratio: 1.9 (calc) (ref <5.0)
Triglycerides: 69 mg/dL (ref <150)

## 2023-12-23 LAB — PSA: PSA: 1.32 ng/mL (ref <=4.00)

## 2023-12-23 LAB — TSH: TSH: 1.44 m[IU]/L (ref 0.40–4.50)

## 2023-12-25 ENCOUNTER — Ambulatory Visit: Payer: Self-pay | Admitting: Nurse Practitioner

## 2024-03-29 ENCOUNTER — Ambulatory Visit: Admitting: Nurse Practitioner

## 2024-03-29 VITALS — BP 122/80 | HR 99 | Temp 98.1°F | Ht 74.0 in | Wt 268.6 lb

## 2024-03-29 DIAGNOSIS — E1165 Type 2 diabetes mellitus with hyperglycemia: Secondary | ICD-10-CM

## 2024-03-29 DIAGNOSIS — Z7985 Long-term (current) use of injectable non-insulin antidiabetic drugs: Secondary | ICD-10-CM

## 2024-03-29 DIAGNOSIS — Z7984 Long term (current) use of oral hypoglycemic drugs: Secondary | ICD-10-CM | POA: Diagnosis not present

## 2024-03-29 LAB — POCT GLYCOSYLATED HEMOGLOBIN (HGB A1C): Hemoglobin A1C: 7.5 % — AB (ref 4.0–5.6)

## 2024-03-29 MED ORDER — GLIPIZIDE 10 MG PO TABS: 10.0000 mg | ORAL_TABLET | Freq: Two times a day (BID) | ORAL | 1 refills | Status: AC

## 2024-03-29 NOTE — Patient Instructions (Signed)
 Nice to see you today  I increased the glipizide  to 10mg  twice a day and sent in a new prescription to the pharmacy Follow up with me in 3 months, sooner if you need me

## 2024-03-29 NOTE — Progress Notes (Signed)
 "  Established Patient Office Visit  Subjective   Patient ID: Justin Jackson, male    DOB: [birth year]  Age: 58 y.o. MRN: [id number]  Chief Complaint  Patient presents with   Diabetes   Medication Refill    Pt requests for all medications to be filled.       Patient currently maintained on Farxiga  10 mg daily, glipizide  5 mg twice daily, metformin  1000 mg twice daily, pioglitazone  45 mg daily, semaglutide  2 mg weekly.  Discussed the use of AI scribe software for clinical note transcription with the patient, who gave verbal consent to proceed.  History of Present Illness Justin Jackson is a 58 year old male with type 2 diabetes who presents for follow-up on blood sugar management.  His blood sugar levels have been stable over the past several months, with home readings ranging from 150 to 190 mg/dL. No episodes of hypoglycemia have occurred. He checks his blood sugar twice a week.  He is on an increased dose of Ozempic  at 2 mg without any adverse effects such as nausea, abdominal pain, or constipation. He continues to take pioglitazone , metformin , glipizide , and Farxiga . His most recent A1c was 7.5%, with a previous value of 7.8%.  His weight was 277 lbs at the beginning of the year and is currently 268 lbs. He eats one meal a day in the evening and snacks throughout the day. His fluid intake includes water, coffee, and soda, with an increase in coffee. He uses regular sugar in his sweet tea and drinks cappuccinos from a machine, which he believes are not sweetened. He has a microwave in his vehicle for meals but lacks a full kitchen setup. He confirms regular bowel movements with the increased Ozempic  dose, occurring daily without difficulty.  No nausea, abdominal pain, constipation, or hypoglycemia. Regular daily bowel movements.     Review of Systems  Constitutional:  Negative for chills and fever.  Respiratory:  Negative for shortness of breath.   Cardiovascular:  Negative for chest  pain.  Neurological:  Negative for headaches.  Psychiatric/Behavioral:  Negative for hallucinations and suicidal ideas.       Objective:     BP 122/80   Pulse 99   Temp 98.1 F (36.7 C) (Oral)   Ht 6' 2 (1.88 m)   Wt 268 lb 9.6 oz (121.8 kg)   SpO2 98%   BMI 34.49 kg/m  BP Readings from Last 3 Encounters:  03/29/24 122/80  12/22/23 122/68  06/30/23 (!) 150/88   Wt Readings from Last 3 Encounters:  03/29/24 268 lb 9.6 oz (121.8 kg)  12/22/23 272 lb 6.4 oz (123.6 kg)  06/30/23 277 lb 12.8 oz (126 kg)   SpO2 Readings from Last 3 Encounters:  03/29/24 98%  12/22/23 94%  06/30/23 97%      Physical Exam Vitals and nursing note reviewed.  Constitutional:      Appearance: Normal appearance.  Cardiovascular:     Rate and Rhythm: Normal rate and regular rhythm.     Heart sounds: Normal heart sounds.  Pulmonary:     Effort: Pulmonary effort is normal.     Breath sounds: Normal breath sounds.  Abdominal:     General: Bowel sounds are normal.  Neurological:     Mental Status: He is alert.      Results for orders placed or performed in visit on 03/29/24  POCT glycosylated hemoglobin (Hb A1C)  Result Value Ref Range   Hemoglobin A1C 7.5 (A) 4.0 - 5.6 %  HbA1c POC (<> result, manual entry)     HbA1c, POC (prediabetic range)     HbA1c, POC (controlled diabetic range)        The ASCVD Risk score (Arnett DK, et al., 2019) failed to calculate for the following reasons:   The valid total cholesterol range is 130 to 320 mg/dL    Assessment & Plan:   Problem List Items Addressed This Visit       Endocrine   Uncontrolled type 2 diabetes mellitus with hyperglycemia (HCC) - Primary   Relevant Medications   glipiZIDE  (GLUCOTROL ) 10 MG tablet   Other Relevant Orders   POCT glycosylated hemoglobin (Hb A1C) (Completed)   Assessment and Plan Assessment & Plan Uncontrolled type 2 diabetes mellitus with hyperglycemia Blood glucose 150-190 mg/dL, J8r improved to  2.4%, weight decreased to 268 lbs. Current regimen includes Ozempic , metformin , pioglitazone , glipizide , and Farxiga . No adverse effects from Ozempic . Goal to reduce A1c below 7% to avoid insulin therapy. - Increased glipizide  to 10 mg twice daily. - Continued metformin , pioglitazone , Ozempic , and Farxiga . - Ensured availability of home glucose monitoring supplies. - Provided education on signs and symptoms of hypoglycemia. - Encouraged use of sugar substitutes in tea. - Scheduled follow-up in 3 months.  Obesity Weight decreased to 268 lbs. Current dietary habits include one meal per day with snacking and high intake of coffee and sweet tea. Limited kitchen facilities due to work conditions. - Encouraged dietary modifications, including reducing sugar intake in beverages. - Advised on the importance of water intake.   Return in about 3 months (around 06/27/2024) for DM recheck.    Adina Crandall, NP  "

## 2024-04-16 ENCOUNTER — Other Ambulatory Visit: Payer: Self-pay | Admitting: Nurse Practitioner

## 2024-04-16 DIAGNOSIS — E1165 Type 2 diabetes mellitus with hyperglycemia: Secondary | ICD-10-CM

## 2024-04-19 ENCOUNTER — Other Ambulatory Visit: Payer: Self-pay | Admitting: Nurse Practitioner

## 2024-04-19 DIAGNOSIS — G629 Polyneuropathy, unspecified: Secondary | ICD-10-CM

## 2024-06-28 ENCOUNTER — Ambulatory Visit: Admitting: Nurse Practitioner
